# Patient Record
Sex: Male | Born: 1990 | Race: White | Hispanic: No | Marital: Married | Smoking: Former smoker
Health system: Southern US, Community
[De-identification: ages and names within clinical notes are randomized; demographics above are authoritative.]

---
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
## 2013-05-09 ENCOUNTER — Emergency Department: Payer: Self-pay

## 2013-07-15 ENCOUNTER — Emergency Department: Payer: Self-pay | Admitting: Emergency Medicine

## 2013-08-17 ENCOUNTER — Emergency Department: Payer: Self-pay | Admitting: Emergency Medicine

## 2013-09-09 ENCOUNTER — Emergency Department: Payer: Self-pay | Admitting: Emergency Medicine

## 2014-04-18 ENCOUNTER — Emergency Department: Payer: Self-pay | Admitting: Emergency Medicine

## 2014-04-18 LAB — URINALYSIS, COMPLETE
BACTERIA: NONE SEEN
BLOOD: NEGATIVE
Bilirubin,UR: NEGATIVE
GLUCOSE, UR: NEGATIVE mg/dL (ref 0–75)
KETONE: NEGATIVE
Leukocyte Esterase: NEGATIVE
NITRITE: NEGATIVE
Ph: 5 (ref 4.5–8.0)
Protein: NEGATIVE
RBC,UR: 1 /HPF (ref 0–5)
SPECIFIC GRAVITY: 1.031 (ref 1.003–1.030)
SQUAMOUS EPITHELIAL: NONE SEEN

## 2014-04-18 LAB — CBC
HCT: 48.2 % (ref 40.0–52.0)
HGB: 16.2 g/dL (ref 13.0–18.0)
MCH: 29.2 pg (ref 26.0–34.0)
MCHC: 33.7 g/dL (ref 32.0–36.0)
MCV: 87 fL (ref 80–100)
Platelet: 237 10*3/uL (ref 150–440)
RBC: 5.55 10*6/uL (ref 4.40–5.90)
RDW: 12.5 % (ref 11.5–14.5)
WBC: 9.1 10*3/uL (ref 3.8–10.6)

## 2014-04-18 LAB — COMPREHENSIVE METABOLIC PANEL
Albumin: 4.2 g/dL (ref 3.4–5.0)
Alkaline Phosphatase: 103 U/L
Anion Gap: 6 — ABNORMAL LOW (ref 7–16)
BILIRUBIN TOTAL: 0.3 mg/dL (ref 0.2–1.0)
BUN: 10 mg/dL (ref 7–18)
CHLORIDE: 107 mmol/L (ref 98–107)
CREATININE: 0.94 mg/dL (ref 0.60–1.30)
Calcium, Total: 9.3 mg/dL (ref 8.5–10.1)
Co2: 27 mmol/L (ref 21–32)
EGFR (Non-African Amer.): >60
Glucose: 104 mg/dL — ABNORMAL HIGH (ref 65–99)
OSMOLALITY: 279 (ref 275–301)
POTASSIUM: 3.4 mmol/L — AB (ref 3.5–5.1)
SGOT(AST): 15 U/L (ref 15–37)
SGPT (ALT): 41 U/L
SODIUM: 140 mmol/L (ref 136–145)
Total Protein: 7.5 g/dL (ref 6.4–8.2)

## 2014-04-18 LAB — LIPASE, BLOOD: Lipase: 85 U/L (ref 73–393)

## 2014-08-04 ENCOUNTER — Emergency Department
Admission: EM | Admit: 2014-08-04 | Discharge: 2014-08-05 | Disposition: A | Discharge status: Home / Self Care | Payer: Self-pay | Attending: Emergency Medicine | Admitting: Emergency Medicine

## 2014-08-04 ENCOUNTER — Encounter: Payer: Self-pay | Admitting: Emergency Medicine

## 2014-08-04 DIAGNOSIS — Y92410 Unspecified street and highway as the place of occurrence of the external cause: Secondary | ICD-10-CM | POA: Insufficient documentation

## 2014-08-04 DIAGNOSIS — Y998 Other external cause status: Secondary | ICD-10-CM | POA: Insufficient documentation

## 2014-08-04 DIAGNOSIS — T148 Other injury of unspecified body region: Secondary | ICD-10-CM | POA: Insufficient documentation

## 2014-08-04 DIAGNOSIS — T07XXXA Unspecified multiple injuries, initial encounter: Secondary | ICD-10-CM

## 2014-08-04 DIAGNOSIS — Y9389 Activity, other specified: Secondary | ICD-10-CM | POA: Insufficient documentation

## 2014-08-04 DIAGNOSIS — S4992XA Unspecified injury of left shoulder and upper arm, initial encounter: Secondary | ICD-10-CM | POA: Insufficient documentation

## 2014-08-04 DIAGNOSIS — Z720 Tobacco use: Secondary | ICD-10-CM | POA: Insufficient documentation

## 2014-08-04 DIAGNOSIS — S30811A Abrasion of abdominal wall, initial encounter: Secondary | ICD-10-CM | POA: Insufficient documentation
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
## 2014-08-05 ENCOUNTER — Emergency Department: Payer: Self-pay

## 2014-08-05 ENCOUNTER — Encounter: Payer: Self-pay | Admitting: Radiology

## 2014-08-05 LAB — CBC WITH DIFFERENTIAL/PLATELET
BASOS PCT: 0 %
Basophils Absolute: 0 10*3/uL (ref 0–0.1)
EOS ABS: 0.3 10*3/uL (ref 0–0.7)
Eosinophils Relative: 3 %
HEMATOCRIT: 47.7 % (ref 40.0–52.0)
Hemoglobin: 16.3 g/dL (ref 13.0–18.0)
LYMPHS ABS: 2.7 10*3/uL (ref 1.0–3.6)
LYMPHS PCT: 25 %
MCH: 29.7 pg (ref 26.0–34.0)
MCHC: 34.2 g/dL (ref 32.0–36.0)
MCV: 86.7 fL (ref 80.0–100.0)
Monocytes Absolute: 0.9 10*3/uL (ref 0.2–1.0)
Monocytes Relative: 8 %
NEUTROS ABS: 6.9 10*3/uL — AB (ref 1.4–6.5)
NEUTROS PCT: 64 %
PLATELETS: 244 10*3/uL (ref 150–440)
RBC: 5.5 MIL/uL (ref 4.40–5.90)
RDW: 12.6 % (ref 11.5–14.5)
WBC: 10.8 10*3/uL — ABNORMAL HIGH (ref 3.8–10.6)

## 2014-08-05 LAB — COMPREHENSIVE METABOLIC PANEL
ALBUMIN: 4.3 g/dL (ref 3.5–5.0)
ALT: 48 U/L (ref 17–63)
ANION GAP: 8 (ref 5–15)
AST: 26 U/L (ref 15–41)
Alkaline Phosphatase: 91 U/L (ref 38–126)
BILIRUBIN TOTAL: 0.3 mg/dL (ref 0.3–1.2)
BUN: 11 mg/dL (ref 6–20)
CALCIUM: 9.3 mg/dL (ref 8.9–10.3)
CO2: 29 mmol/L (ref 22–32)
Chloride: 105 mmol/L (ref 101–111)
Creatinine, Ser: 0.8 mg/dL (ref 0.61–1.24)
GFR calc Af Amer: >60 mL/min (ref >60)
GFR calc non Af Amer: >60 mL/min (ref >60)
Glucose, Bld: 90 mg/dL (ref 65–99)
Potassium: 3.6 mmol/L (ref 3.5–5.1)
Sodium: 142 mmol/L (ref 135–145)
Total Protein: 7.6 g/dL (ref 6.5–8.1)

## 2014-08-05 LAB — LIPASE, BLOOD: Lipase: 26 U/L (ref 22–51)

## 2014-08-05 LAB — PROTIME-INR
INR: 0.93
PROTHROMBIN TIME: 12.7 s (ref 11.4–15.0)

## 2014-08-05 LAB — ETHANOL: ALCOHOL ETHYL (B): 7 mg/dL — AB (ref <5)

## 2014-08-05 MED ORDER — ONDANSETRON HCL 4 MG/2ML IJ SOLN
4.0000 mg | Freq: Once | INTRAMUSCULAR | Status: AC
  Administered 2014-08-05: 4 mg via INTRAVENOUS

## 2014-08-05 MED ORDER — MORPHINE SULFATE 4 MG/ML IJ SOLN
4.0000 mg | Freq: Once | INTRAMUSCULAR | Status: AC
  Administered 2014-08-05: 4 mg via INTRAVENOUS

## 2014-08-05 MED ORDER — SODIUM CHLORIDE 0.9 % IV BOLUS (SEPSIS)
1000.0000 mL | Freq: Once | INTRAVENOUS | Status: AC
Start: 2014-08-05 — End: 2014-08-05
  Administered 2014-08-05: 1000 mL via INTRAVENOUS

## 2014-08-05 MED ORDER — IBUPROFEN 200 MG PO TABS: 800.0000 mg | ORAL_TABLET | Freq: Three times a day (TID) | ORAL | Status: DC | PRN

## 2014-08-05 MED ORDER — MORPHINE SULFATE 4 MG/ML IJ SOLN
INTRAMUSCULAR | Status: AC
  Filled 2014-08-05: qty 1

## 2014-08-05 MED ORDER — ONDANSETRON HCL 4 MG/2ML IJ SOLN
INTRAMUSCULAR | Status: AC
  Filled 2014-08-05: qty 2

## 2014-08-05 MED ORDER — OXYCODONE-ACETAMINOPHEN 5-325 MG PO TABS: 1.0000 | ORAL_TABLET | ORAL | Status: DC | PRN

## 2014-08-05 MED ORDER — IOHEXOL 350 MG/ML SOLN
100.0000 mL | Freq: Once | INTRAVENOUS | Status: AC | PRN
  Administered 2014-08-05: 100 mL via INTRAVENOUS
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
## 2015-11-04 ENCOUNTER — Encounter: Payer: Self-pay | Admitting: Emergency Medicine

## 2015-11-04 ENCOUNTER — Emergency Department: Payer: No Typology Code available for payment source

## 2015-11-04 ENCOUNTER — Emergency Department
Admission: EM | Admit: 2015-11-04 | Discharge: 2015-11-04 | Disposition: A | Discharge status: Home / Self Care | Payer: No Typology Code available for payment source | Attending: Emergency Medicine | Admitting: Emergency Medicine

## 2015-11-04 DIAGNOSIS — F17200 Nicotine dependence, unspecified, uncomplicated: Secondary | ICD-10-CM | POA: Diagnosis not present

## 2015-11-04 DIAGNOSIS — M545 Low back pain: Secondary | ICD-10-CM | POA: Diagnosis present

## 2015-11-04 DIAGNOSIS — Y999 Unspecified external cause status: Secondary | ICD-10-CM | POA: Diagnosis not present

## 2015-11-04 DIAGNOSIS — Y92410 Unspecified street and highway as the place of occurrence of the external cause: Secondary | ICD-10-CM | POA: Diagnosis not present

## 2015-11-04 DIAGNOSIS — G8911 Acute pain due to trauma: Secondary | ICD-10-CM

## 2015-11-04 DIAGNOSIS — T148XXA Other injury of unspecified body region, initial encounter: Secondary | ICD-10-CM

## 2015-11-04 DIAGNOSIS — Y9389 Activity, other specified: Secondary | ICD-10-CM | POA: Insufficient documentation

## 2015-11-04 DIAGNOSIS — T148 Other injury of unspecified body region: Secondary | ICD-10-CM | POA: Diagnosis not present

## 2015-11-04 MED ORDER — NAPROXEN 500 MG PO TABS: 500.0000 mg | ORAL_TABLET | Freq: Two times a day (BID) | ORAL | 0 refills | Status: DC

## 2015-11-04 MED ORDER — CYCLOBENZAPRINE HCL 10 MG PO TABS: 10.0000 mg | ORAL_TABLET | Freq: Three times a day (TID) | ORAL | 0 refills | Status: DC | PRN
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
## 2015-11-04 NOTE — ED Notes (Signed)
Pt verbalized understanding of discharge instructions. NAD at this time. 

## 2015-12-21 ENCOUNTER — Encounter: Payer: Self-pay | Admitting: *Deleted

## 2015-12-21 DIAGNOSIS — Z23 Encounter for immunization: Secondary | ICD-10-CM | POA: Insufficient documentation

## 2015-12-21 DIAGNOSIS — W260XXA Contact with knife, initial encounter: Secondary | ICD-10-CM | POA: Insufficient documentation

## 2015-12-21 DIAGNOSIS — Y9389 Activity, other specified: Secondary | ICD-10-CM | POA: Insufficient documentation

## 2015-12-21 DIAGNOSIS — F17200 Nicotine dependence, unspecified, uncomplicated: Secondary | ICD-10-CM | POA: Insufficient documentation

## 2015-12-21 DIAGNOSIS — Z791 Long term (current) use of non-steroidal anti-inflammatories (NSAID): Secondary | ICD-10-CM | POA: Insufficient documentation

## 2015-12-21 DIAGNOSIS — Y929 Unspecified place or not applicable: Secondary | ICD-10-CM | POA: Insufficient documentation

## 2015-12-21 DIAGNOSIS — S61210A Laceration without foreign body of right index finger without damage to nail, initial encounter: Secondary | ICD-10-CM | POA: Insufficient documentation

## 2015-12-21 DIAGNOSIS — Y999 Unspecified external cause status: Secondary | ICD-10-CM | POA: Insufficient documentation

## 2015-12-21 NOTE — ED Triage Notes (Signed)
Pt has a laceration to right index finger .  Cut finger with a knife.  Bleeding controlled.

## 2015-12-21 NOTE — ED Notes (Signed)
FIRST NURSE NOTE: Patient presents to with laceration to his RIGHT hand; over the PIP joint of the first digit. Patient reports that he cut it with a knife. Bleeding controlled at this time.

## 2015-12-22 ENCOUNTER — Emergency Department
Admission: EM | Admit: 2015-12-22 | Discharge: 2015-12-22 | Disposition: A | Discharge status: Home / Self Care | Payer: Self-pay | Attending: Emergency Medicine | Admitting: Emergency Medicine

## 2015-12-22 DIAGNOSIS — S61219A Laceration without foreign body of unspecified finger without damage to nail, initial encounter: Secondary | ICD-10-CM

## 2015-12-22 MED ORDER — TETANUS-DIPHTH-ACELL PERTUSSIS 5-2.5-18.5 LF-MCG/0.5 IM SUSP
INTRAMUSCULAR | Status: AC
  Administered 2015-12-22: 0.5 mL via INTRAMUSCULAR
  Filled 2015-12-22: qty 0.5

## 2015-12-22 MED ORDER — LIDOCAINE HCL (PF) 1 % IJ SOLN
INTRAMUSCULAR | Status: AC
Start: 2015-12-22 — End: 2015-12-22
  Administered 2015-12-22: 5 mL
  Filled 2015-12-22: qty 5

## 2015-12-22 NOTE — ED Notes (Signed)
Pt waiting to be sutured.  Pt informed md with critical pt.   Apologized for delay.

## 2015-12-22 NOTE — Discharge Instructions (Signed)
Recheck wound in 2 days return if there is any redness swelling pus or increased pain. Stitches should come out in about 7 days. You can [facility]-up with [facility] acute[facility] you can see is [facility] or [facility] or one of the urgent cares in the area. I would call and ask first to make sure they can take at stitches most of them But not all.

## 2015-12-22 NOTE — ED Notes (Signed)
Laceration dressed with gauze and 2x2's.  D/c inst to pt.

## 2015-12-22 NOTE — ED Provider Notes (Signed)
[facility] Emergency Department Provider Note   ____________________________________________   First MD Initiated Contact with Patient 12/22/15 0201     (approximate)  I have reviewed the triage vital signs and the nursing notes.   HISTORY  Chief Complaint Laceration    HPI Jacob Schmitt is a 25 y.o. male patient reports he cut his right index finger with a knife. Trying to open a box. He reports he is not open boxes similar object does not do any really dirty work with a knife. He reports she has good sensation in the tip of the finger and good range of motion.   No past medical history on file.  There are no active problems to display for this patient.   No past surgical history on file.  Prior to Admission medications   Medication Sig Start Date End Date Taking? Authorizing Provider  cyclobenzaprine (FLEXERIL) 10 MG tablet Take 1 tablet (10 mg total) by mouth 3 (three) times daily as needed for muscle spasms. 11/04/15   Chinita Pesterari B Triplett, FNP  naproxen (NAPROSYN) 500 MG tablet Take 1 tablet (500 mg total) by mouth 2 (two) times daily with a meal. 11/04/15   Chinita Pesterari B Triplett, FNP    Allergies Review of patient's allergies indicates no known allergies.  No family history on file.  Social History Social History  Substance Use Topics  . Smoking status: Light Tobacco Smoker  . Smokeless tobacco: Never Used  . Alcohol use 0.6 oz/week    1 Cans of beer per week    Review of Systems Constitutional: No fever/chills Eyes: No visual changes. ENT: No sore throat. Cardiovascular: Denies chest pain. Respiratory: Denies shortness of breath. Gastrointestinal: No abdominal pain.  No nausea, no vomiting.  No diarrhea.  No constipation. .  10-point ROS otherwise negative.  ____________________________________________   PHYSICAL EXAM:  VITAL SIGNS: ED Triage Vitals  Enc Vitals Group     BP 12/21/15 2152 130/82     Pulse Rate 12/21/15 2152  82     Resp 12/21/15 2152 16     Temp 12/21/15 2152 98.1 F (36.7 C)     Temp Source 12/21/15 2152 Oral     SpO2 12/21/15 2152 99 %     Weight 12/21/15 2153 259 lb (117.5 kg)     Height 12/21/15 2153 5\' 10"  (1.778 m)     Head Circumference --      Peak Flow --      Pain Score 12/21/15 2154 9     Pain Loc --      Pain Edu? --      Excl. in GC? --    Constitutional: Alert and oriented. Well appearing and in no acute distress. Eyes: Conjunctivae are normal. PERRL. EOMI. Head: Atraumatic. Nose: No congestion/rhinnorhea. Mouth/Throat: Mucous membranes are moist.  Oropharynx non-erythematous. Neck: No stridor.  Cardiovascular: Normal rate, regular rhythm.Peri [place].  Good peripheral circulation. Respiratory: Normal respiratory effort.  No retractions.  Gastrointestinal: Soft and nontender. No distention. No abdominal bruits. No CVA tenderness. Musculoskeletal: No lower extremity tenderness nor edema.  No joint effusions. Right index finger approximately 1 cm laceration on the lateral side at the PIP joint.   ____________________________________________   LABS (all labs ordered are listed, but only abnormal results are displayed)  Labs Reviewed - No data to display ____________________________________________  EKG   ____________________________________________  RADIOLOGY   ____________________________________________   PROCEDURES  Procedure(s) performed: Sensation distal to the laceration is intact. Patient has full range  of motion of the finger and good strength both flexion and extension of the distal and mid phalangeal phalanges. Wound was cleaned with Betadine and anesthetized with 1% lidocaine with epi irrigated with normal saline did not see any deep structures injured no foreign bodies were seen wound was closed with 3 stitches of  Procedures  Critical Care performed:  ____________________________________________   INITIAL IMPRESSION / ASSESSMENT AND PLAN / ED  COURSE  Pertinent labs & imaging results that were available during my care of the patient were reviewed by me and considered in my medical decision making (see chart for details).    Clinical Course     ____________________________________________   FINAL CLINICAL IMPRESSION(S) / ED DIAGNOSES  Final diagnoses:  Laceration of finger, initial encounter      NEW MEDICATIONS STARTED DURING THIS VISIT:  Discharge Medication List as of 12/22/2015  2:43 AM       Note:  This document was prepared using Dragon voice recognition software and may include unintentional dictation errors.    Arnaldo Natal, MD 12/22/15 [phone]

## 2015-12-22 NOTE — ED Notes (Signed)
Laceration to right index finger.  Cut  With a knife tonight.  Bleeding controlled.

## 2016-01-28 ENCOUNTER — Encounter: Payer: Self-pay | Admitting: Emergency Medicine

## 2016-01-28 ENCOUNTER — Emergency Department
Admission: EM | Admit: 2016-01-28 | Discharge: 2016-01-28 | Disposition: A | Discharge status: Home / Self Care | Payer: Self-pay | Attending: Emergency Medicine | Admitting: Emergency Medicine

## 2016-01-28 DIAGNOSIS — F17200 Nicotine dependence, unspecified, uncomplicated: Secondary | ICD-10-CM | POA: Insufficient documentation

## 2016-01-28 DIAGNOSIS — J069 Acute upper respiratory infection, unspecified: Secondary | ICD-10-CM

## 2016-01-28 DIAGNOSIS — Z79899 Other long term (current) drug therapy: Secondary | ICD-10-CM | POA: Insufficient documentation

## 2016-01-28 MED ORDER — ACETAMINOPHEN-CODEINE 120-12 MG/5ML PO SUSP: 10.0000 mL | Freq: Four times a day (QID) | ORAL | 0 refills | Status: AC | PRN

## 2016-01-28 NOTE — ED Triage Notes (Addendum)
Nasal congestion with intermittent fever and cough. Has had intermittent headache as well.  Took nyquil last night. Has had body aches. NAD. Ambulatory to triage. Respirations unlabored.

## 2016-01-28 NOTE — ED Provider Notes (Signed)
[facility] Emergency Department Provider Note  ____________________________________________  Time seen: Approximately 5:02 PM  I have reviewed the triage vital signs and the nursing notes.   HISTORY  Chief Complaint Nasal Congestion   HPI Jacob Schmitt is a 25 y.o. male who presents to the emergency department for evaluation of nasal congestion with fever and cough. No relief with Nyquil last night. Symptoms started 2 days ago. His toddler child has similar symptoms.   History reviewed. No pertinent past medical history.  There are no active problems to display for this patient.   History reviewed. No pertinent surgical history.  Prior to Admission medications   Medication Sig Start Date End Date Taking? Authorizing Provider  acetaminophen-codeine 120-12 MG/5ML suspension Take 10 mLs by mouth every 6 (six) hours as needed for pain. 01/28/16 01/27/17  Chinita Pesterari B Avenly Roberge, FNP  cyclobenzaprine (FLEXERIL) 10 MG tablet Take 1 tablet (10 mg total) by mouth 3 (three) times daily as needed for muscle spasms. 11/04/15   Chinita Pesterari B Tashera Montalvo, FNP  naproxen (NAPROSYN) 500 MG tablet Take 1 tablet (500 mg total) by mouth 2 (two) times daily with a meal. 11/04/15   Chinita Pesterari B Marquest Gunkel, FNP    Allergies Patient has no known allergies.  History reviewed. No pertinent family history.  Social History Social History  Substance Use Topics  . Smoking status: Light Tobacco Smoker  . Smokeless tobacco: Never Used  . Alcohol use 0.6 oz/week    1 Cans of beer per week    Review of Systems Constitutional: Positive fever/chills ENT: Positive sore throat. Cardiovascular: Denies chest pain. Respiratory: Negative for shortness of breath. Positive for cough. Gastrointestinal: Positive for nausea,  negative for vomiting.  Negative for diarrhea.  Musculoskeletal: Positive for body aches Skin: Negative for rash. Neurological: Negative for  headaches ____________________________________________   PHYSICAL EXAM:  VITAL SIGNS: ED Triage Vitals  Enc Vitals Group     BP 01/28/16 1638 133/78     Pulse Rate 01/28/16 1638 89     Resp 01/28/16 1638 18     Temp 01/28/16 1637 98.5 F (36.9 C)     Temp Source 01/28/16 1637 Oral     SpO2 01/28/16 1638 97 %     Weight 01/28/16 1637 251 lb (113.9 kg)     Height 01/28/16 1637 5\' 10"  (1.778 m)     Head Circumference --      Peak Flow --      Pain Score 01/28/16 1637 8     Pain Loc --      Pain Edu? --      Excl. in GC? --     Constitutional: Alert and oriented. Acutely ill appearing and in no acute distress. Eyes: Conjunctivae are normal. EOMI. Ears: Bilateral tympanic membranes appear within normal limits Nose: Sinus congestion; clear rhinnorhea with body mucous membranes. Mouth/Throat: Mucous membranes are moist.  Oropharynx erythematous. Tonsils appear 1+ without exudate. Neck: No stridor.  Lymphatic: No cervical lymphadenopathy. Cardiovascular: Normal rate, regular rhythm. Grossly normal heart sounds.  Good peripheral circulation. Respiratory: Normal respiratory effort.  No retractions. Breath sounds clear to auscultation throughout. Gastrointestinal: Soft and nontender.  Musculoskeletal: FROM x 4 extremities.  Neurologic:  Normal speech and language.  Skin:  Skin is warm, dry and intact. No rash noted. Psychiatric: Mood and affect are normal. Speech and behavior are normal.  ____________________________________________   LABS (all labs ordered are listed, but only abnormal results are displayed)  Labs Reviewed - No data to  display ____________________________________________  EKG  At indicated ____________________________________________  RADIOLOGY  Not indicated ____________________________________________   PROCEDURES  Procedure(s) performed: None  Critical Care performed: No  ____________________________________________   INITIAL IMPRESSION /  ASSESSMENT AND PLAN / ED COURSE  Clinical Course     Pertinent labs & imaging results that were available during my care of the patient were reviewed by me and considered in my medical decision making (see chart for details).   Patient was instructed to continue ibuprofen. He was instructed to take the Tylenol with Codeine for generalized body aches and fever with cough. He was instructed not to take additional Tylenol if he is taking the prescription elixir. He was encouraged to follow up with the primary care provider of his choice for symptoms that are not improving over the next 2-3 days. He is instructed to return to the emergency department for symptoms that change or worsen if he is unable schedule an appointment. ____________________________________________   FINAL CLINICAL IMPRESSION(S) / ED DIAGNOSES  Final diagnoses:  Viral upper respiratory tract infection    Note:  This document was prepared using Dragon voice recognition software and may include unintentional dictation errors.     Chinita PesterCari B Jacob Conry, FNP 01/28/16 1940    Sharman CheekPhillip Stafford, MD 01/28/16 [phone]

## 2016-01-28 NOTE — ED Notes (Signed)
Pt c/o cough and congestion x 2days. Pt febrile last night. Denies any n/v/d

## 2016-06-28 ENCOUNTER — Emergency Department
Admission: EM | Admit: 2016-06-28 | Discharge: 2016-06-28 | Disposition: A | Discharge status: Home / Self Care | Payer: Self-pay | Attending: Emergency Medicine | Admitting: Emergency Medicine

## 2016-06-28 ENCOUNTER — Encounter: Payer: Self-pay | Admitting: Intensive Care

## 2016-06-28 DIAGNOSIS — M5441 Lumbago with sciatica, right side: Secondary | ICD-10-CM | POA: Insufficient documentation

## 2016-06-28 DIAGNOSIS — Z87891 Personal history of nicotine dependence: Secondary | ICD-10-CM | POA: Insufficient documentation

## 2016-06-28 DIAGNOSIS — M5442 Lumbago with sciatica, left side: Secondary | ICD-10-CM | POA: Insufficient documentation

## 2016-06-28 MED ORDER — PREDNISONE 10 MG (21) PO TBPK: ORAL_TABLET | ORAL | 0 refills | Status: DC

## 2016-06-28 MED ORDER — KETOROLAC TROMETHAMINE 60 MG/2ML IM SOLN
30.0000 mg | Freq: Once | INTRAMUSCULAR | Status: AC
  Administered 2016-06-28: 30 mg via INTRAMUSCULAR
  Filled 2016-06-28: qty 2

## 2016-06-28 MED ORDER — IBUPROFEN 800 MG PO TABS: 800.0000 mg | ORAL_TABLET | Freq: Three times a day (TID) | ORAL | 0 refills | Status: DC | PRN

## 2016-06-28 NOTE — ED Provider Notes (Signed)
[facility] Emergency Department Provider Note  ____________________________________________  Time seen: Approximately 7:31 PM  I have reviewed the triage vital signs and the nursing notes.   HISTORY  Chief Complaint Back Pain    HPI Jacob Schmitt is a 26 y.o. male that presents to emergency department with low back pain for 2 weeks. Patient states that pain begins in the middle of his lower back and radiates down the back of both legs. Walking and movement triggers the pain. Patient has taken Goody powders and icy hot for pain. Patient states that he was diagnosed with sciatica several years ago and received "a shot of something and it went away." Patient denies fever, shortness of breath, chest pain, nausea, vomiting, abdominal pain, bowel or bladder dysfunction, dysuria, urgency, frequency, saddle paresthesias, numbness, tingling.   History reviewed. No pertinent past medical history.  There are no active problems to display for this patient.   History reviewed. No pertinent surgical history.  Prior to Admission medications   Medication Sig Start Date End Date Taking? Authorizing Provider  acetaminophen-codeine 120-12 MG/5ML suspension Take 10 mLs by mouth every 6 (six) hours as needed for pain. 01/28/16 01/27/17  Chinita Pester, FNP  cyclobenzaprine (FLEXERIL) 10 MG tablet Take 1 tablet (10 mg total) by mouth 3 (three) times daily as needed for muscle spasms. 11/04/15   Chinita Pester, FNP  ibuprofen (ADVIL,MOTRIN) 800 MG tablet Take 1 tablet (800 mg total) by mouth every 8 (eight) hours as needed. 06/28/16   Enid Derry, PA-C  naproxen (NAPROSYN) 500 MG tablet Take 1 tablet (500 mg total) by mouth 2 (two) times daily with a meal. 11/04/15   Cari B Triplett, FNP  predniSONE (STERAPRED UNI-PAK 21 TAB) 10 MG (21) TBPK tablet Take 6 tablets on day 1, take 5 tablets on day 2, take 4 tablets on day 3, take 3 tablets on day 4, take 2 tablets on day 5, take 1  tablet on day 6 06/28/16   Enid Derry, PA-C    Allergies Patient has no known allergies.  History reviewed. No pertinent family history.  Social History Social History  Substance Use Topics  . Smoking status: Former Games developer  . Smokeless tobacco: Never Used  . Alcohol use 0.6 oz/week    1 Cans of beer per week     Review of Systems  Constitutional: No fever/chills Cardiovascular: No chest pain. Respiratory: No SOB. Gastrointestinal: No abdominal pain.  No nausea, no vomiting.  Musculoskeletal: Positive for back pain. Skin: Negative for rash, abrasions, lacerations, ecchymosis. Neurological: Negative for headaches, numbness or tingling   ____________________________________________   PHYSICAL EXAM:  VITAL SIGNS: ED Triage Vitals  Enc Vitals Group     BP 06/28/16 1850 119/69     Pulse Rate 06/28/16 1850 78     Resp 06/28/16 1850 15     Temp 06/28/16 1850 98.3 F (36.8 C)     Temp Source 06/28/16 1850 Oral     SpO2 06/28/16 1850 98 %     Weight 06/28/16 1851 245 lb (111.1 kg)     Height 06/28/16 1851 5' 10.5" (1.791 m)     Head Circumference --      Peak Flow --      Pain Score 06/28/16 1850 10     Pain Loc --      Pain Edu? --      Excl. in GC? --      Constitutional: Alert and oriented. Well appearing and  in no acute distress. Eyes: Conjunctivae are normal. PERRL. EOMI. Head: Atraumatic. ENT:      Ears:      Nose: No congestion/rhinnorhea.      Mouth/Throat: Mucous membranes are moist.  Neck: No stridor.   Cardiovascular: Normal rate, regular rhythm.  Good peripheral circulation. Respiratory: Normal respiratory effort without tachypnea or retractions. Lungs CTAB. Good air entry to the bases with no decreased or absent breath sounds. Gastrointestinal: Bowel sounds 4 quadrants. Soft and nontender to palpation. No guarding or rigidity. No palpable masses. No distention. No CVA tenderness. Musculoskeletal: Full range of motion to all extremities. No gross  deformities appreciated. No tenderness to palpation over thoracic or lumbar spine. Tenderness to palpation over right and left SI joint. Positive straight leg and cross leg raise. Neurologic:  Normal speech and language. No gross focal neurologic deficits are appreciated.  Skin:  Skin is warm, dry and intact. No rash noted. Psychiatric: Mood and affect are normal. Speech and behavior are normal. Patient exhibits appropriate insight and judgement.   ____________________________________________   LABS (all labs ordered are listed, but only abnormal results are displayed)  Labs Reviewed - No data to display ____________________________________________  EKG   ____________________________________________  RADIOLOGY  No results found.  ____________________________________________    PROCEDURES  Procedure(s) performed:    Procedures    Medications  ketorolac (TORADOL) injection 30 mg (30 mg Intramuscular Given 06/28/16 1945)     ____________________________________________   INITIAL IMPRESSION / ASSESSMENT AND PLAN / ED COURSE  Pertinent labs & imaging results that were available during my care of the patient were reviewed by me and considered in my medical decision making (see chart for details).  Review of the [place] CSRS was performed in accordance of the [facility] prior to dispensing any controlled drugs.     Patient's diagnosis is consistent with low back pain with sciatica. Vital signs and exam are reassuring at this time. No trauma. I don't think there is indication for imaging at this time. Patient was given Toradol injection in ED. Patient will be discharged home with prescriptions for prednisone and ibuprofen. Patient is to follow up with PCP as directed. Patient is given ED precautions to return to the ED for any worsening or new symptoms.     ____________________________________________  FINAL CLINICAL IMPRESSION(S) / ED DIAGNOSES  Final diagnoses:  Acute  midline low back pain with bilateral sciatica      NEW MEDICATIONS STARTED DURING THIS VISIT:  Discharge Medication List as of 06/28/2016  7:46 PM    START taking these medications   Details  ibuprofen (ADVIL,MOTRIN) 800 MG tablet Take 1 tablet (800 mg total) by mouth every 8 (eight) hours as needed., Starting Fri 06/28/2016, Print    predniSONE (STERAPRED UNI-PAK 21 TAB) 10 MG (21) TBPK tablet Take 6 tablets on day 1, take 5 tablets on day 2, take 4 tablets on day 3, take 3 tablets on day 4, take 2 tablets on day 5, take 1 tablet on day 6, Print            This chart was dictated using voice recognition software/Dragon. Despite best efforts to proofread, errors can occur which can change the meaning. Any change was purely unintentional.    Enid Derry, PA-C 06/28/16 2006    Myrna Blazer, MD 06/28/16 [phone]

## 2016-06-28 NOTE — ED Triage Notes (Signed)
PAtient presents to ER with lower back pain X2 weeks. Denies injury/trauma. States "I feel like my back locks up when I go to stand. I have had to crawl around the house some" Pt transferred from wheelchair to bed with NAD noted

## 2016-09-04 ENCOUNTER — Emergency Department: Payer: Self-pay

## 2016-09-04 DIAGNOSIS — Z87891 Personal history of nicotine dependence: Secondary | ICD-10-CM | POA: Insufficient documentation

## 2016-09-04 DIAGNOSIS — Y92009 Unspecified place in unspecified non-institutional (private) residence as the place of occurrence of the external cause: Secondary | ICD-10-CM | POA: Insufficient documentation

## 2016-09-04 DIAGNOSIS — Y998 Other external cause status: Secondary | ICD-10-CM | POA: Insufficient documentation

## 2016-09-04 DIAGNOSIS — S82892A Other fracture of left lower leg, initial encounter for closed fracture: Secondary | ICD-10-CM | POA: Insufficient documentation

## 2016-09-04 DIAGNOSIS — Y9301 Activity, walking, marching and hiking: Secondary | ICD-10-CM | POA: Insufficient documentation

## 2016-09-04 DIAGNOSIS — W108XXA Fall (on) (from) other stairs and steps, initial encounter: Secondary | ICD-10-CM | POA: Insufficient documentation

## 2016-09-04 DIAGNOSIS — Z791 Long term (current) use of non-steroidal anti-inflammatories (NSAID): Secondary | ICD-10-CM | POA: Insufficient documentation

## 2016-09-04 NOTE — ED Triage Notes (Signed)
Pt to ED via POV with c/o LEFT ankle pain s/p tripping on some steps. CMS intact, no deformity noted.

## 2016-09-05 ENCOUNTER — Emergency Department
Admission: EM | Admit: 2016-09-05 | Discharge: 2016-09-05 | Disposition: A | Discharge status: Home / Self Care | Payer: Self-pay | Attending: Student in an Organized Health Care Education/Training Program | Admitting: Student in an Organized Health Care Education/Training Program

## 2016-09-05 DIAGNOSIS — S82892A Other fracture of left lower leg, initial encounter for closed fracture: Secondary | ICD-10-CM

## 2016-09-05 MED ORDER — OXYCODONE-ACETAMINOPHEN 5-325 MG PO TABS
1.0000 | ORAL_TABLET | Freq: Once | ORAL | Status: AC
  Administered 2016-09-05: 1 via ORAL

## 2016-09-05 MED ORDER — HYDROCODONE-ACETAMINOPHEN 5-325 MG PO TABS: 1.0000 | ORAL_TABLET | ORAL | 0 refills | Status: DC | PRN

## 2016-09-05 NOTE — ED Notes (Signed)

## 2016-09-05 NOTE — ED Provider Notes (Signed)
[facility] Emergency Department Provider Note    First MD Initiated Contact with Patient 09/05/16 0216     (approximate)  I have reviewed the triage vital signs and the nursing notes.   HISTORY  Chief Complaint Ankle Pain    HPI Jacob Schmitt is a 26 y.o. male presents with acute left ankle pain that occurred after falling while walking down some steps at home. States he initially felt twice after rolling his ankle first time. Denies hitting his head. No other injury. States the pain is mild to moderate. Has been able to walk but with significant discomfort. No numbness or tingling.   History reviewed. No pertinent past medical history. No family history on file. History reviewed. No pertinent surgical history. There are no active problems to display for this patient.     Prior to Admission medications   Medication Sig Start Date End Date Taking? Authorizing Provider  acetaminophen-codeine 120-12 MG/5ML suspension Take 10 mLs by mouth every 6 (six) hours as needed for pain. 01/28/16 01/27/17  Triplett, Kasandra Knudsenari B, FNP  cyclobenzaprine (FLEXERIL) 10 MG tablet Take 1 tablet (10 mg total) by mouth 3 (three) times daily as needed for muscle spasms. 11/04/15   Triplett, Rulon Eisenmengerari B, FNP  ibuprofen (ADVIL,MOTRIN) 800 MG tablet Take 1 tablet (800 mg total) by mouth every 8 (eight) hours as needed. 06/28/16   Enid DerryWagner, Ashley, PA-C  naproxen (NAPROSYN) 500 MG tablet Take 1 tablet (500 mg total) by mouth 2 (two) times daily with a meal. 11/04/15   Triplett, Cari B, FNP  predniSONE (STERAPRED UNI-PAK 21 TAB) 10 MG (21) TBPK tablet Take 6 tablets on day 1, take 5 tablets on day 2, take 4 tablets on day 3, take 3 tablets on day 4, take 2 tablets on day 5, take 1 tablet on day 6 06/28/16   Enid DerryWagner, Ashley, PA-C    Allergies Patient has no known allergies.    Social History Social History  Substance Use Topics  . Smoking status: Former Games developermoker  . Smokeless tobacco: Never  Used  . Alcohol use 0.6 oz/week    1 Cans of beer per week    Review of Systems Patient denies headaches, rhinorrhea, blurry vision, numbness, shortness of breath, chest pain, edema, cough, abdominal pain, nausea, vomiting, diarrhea, dysuria, fevers, rashes or hallucinations unless otherwise stated above in HPI. ____________________________________________   PHYSICAL EXAM:  VITAL SIGNS: Vitals:   09/04/16 2318  BP: 132/77  Pulse: 89  Resp: 18  Temp: 98.9 F (37.2 C)    Constitutional: Alert and oriented. Well appearing and in no acute distress. Eyes: Conjunctivae are normal.  Head: Atraumatic. Nose: No congestion/rhinnorhea. Mouth/Throat: Mucous membranes are moist.   Neck: No stridor. Painless ROM.  Cardiovascular: Normal rate, regular rhythm. Grossly normal heart sounds.  Good peripheral circulation. Respiratory: Normal respiratory effort.  No retractions. Lungs CTAB. Gastrointestinal: Soft and nontender. No distention. No abdominal bruits. No CVA tenderness. Musculoskeletal: swelling and ttp to lateral malleolus, no calcaneal or midfoot ttp.  No pain at proximal fibula Neurologic:  Normal speech and language. No gross focal neurologic deficits are appreciated. No facial droop Skin:  Skin is warm, dry and intact. No rash noted. Psychiatric: Mood and affect are normal. Speech and behavior are normal.  ____________________________________________   LABS (all labs ordered are listed, but only abnormal results are displayed)  No results found for this or any previous visit (from the past 24 hour(s)). ____________________________________________ ___________________________________  RADIOLOGY  I personally  reviewed all radiographic images ordered to evaluate for the above acute complaints and reviewed radiology reports and findings.  These findings were personally discussed with the patient.  Please see medical record for radiology  report.  ____________________________________________   PROCEDURES  Procedure(s) performed:  ORTHOPEDIC INJURY TREATMENT Date/Time: 09/05/2016 2:50 AM Performed by: Willy Eddy Authorized by: Willy Eddy  Injury location: ankle Location details: right ankle Injury type: fracture Fracture type: lateral malleolus Pre-procedure distal perfusion: normal Pre-procedure neurological function: normal Pre-procedure range of motion: reduced Manipulation performed: no Immobilization: splint Splint type: short leg Supplies used: Ortho-Glass Post-procedure distal perfusion: normal Post-procedure neurological function: normal Post-procedure range of motion: unchanged Patient tolerance: Patient tolerated the procedure well with no immediate complications       Critical Care performed: no ____________________________________________   INITIAL IMPRESSION / ASSESSMENT AND PLAN / ED COURSE  Pertinent labs & imaging results that were available during my care of the patient were reviewed by me and considered in my medical decision making (see chart for details).  DDX: fracture, sprain, contusion  LEXIE MORINI is a 26 y.o. who presents to the ED with 26 y.o. male with left ankle injury. Patient is AFVSS in ED. Exam as above. Given current presentation have considered the above differential. Denies any other injuries. Denies motor or sensory loss. Able to bear weight. Afebrile and VSS in Ed. Exam as above. NV intact throughout and distal to injury. Pt able to range joint. No clinical suspicion for infectious process or septic joint. Treatments will include observation, X-rays.  X-rays w fracture. No other injuries reported or noted on exam. Patient placed in splint as above.   Discussed supportive care and follow up with pt.       ____________________________________________   FINAL CLINICAL IMPRESSION(S) / ED DIAGNOSES  Final diagnoses:  Closed fracture of left  ankle, initial encounter      NEW MEDICATIONS STARTED DURING THIS VISIT:  New Prescriptions   No medications on file     Note:  This document was prepared using Dragon voice recognition software and may include unintentional dictation errors.    Willy Eddy, MD 09/05/16 0300

## 2016-09-29 ENCOUNTER — Emergency Department
Admission: EM | Admit: 2016-09-29 | Discharge: 2016-09-29 | Discharge status: Against Medical Advice | Payer: Self-pay | Attending: Emergency Medicine | Admitting: Emergency Medicine

## 2016-09-29 NOTE — ED Notes (Signed)
Called pt x2 again for triage with no answer

## 2016-09-29 NOTE — ED Notes (Signed)
Pt did not answer for triage when called x2

## 2016-12-18 ENCOUNTER — Emergency Department
Admission: EM | Admit: 2016-12-18 | Discharge: 2016-12-18 | Disposition: A | Discharge status: Home / Self Care | Payer: Self-pay | Attending: Emergency Medicine | Admitting: Emergency Medicine

## 2016-12-18 ENCOUNTER — Encounter: Payer: Self-pay | Admitting: Emergency Medicine

## 2016-12-18 DIAGNOSIS — R220 Localized swelling, mass and lump, head: Secondary | ICD-10-CM

## 2016-12-18 DIAGNOSIS — K0381 Cracked tooth: Secondary | ICD-10-CM | POA: Insufficient documentation

## 2016-12-18 DIAGNOSIS — S025XXA Fracture of tooth (traumatic), initial encounter for closed fracture: Secondary | ICD-10-CM

## 2016-12-18 DIAGNOSIS — K047 Periapical abscess without sinus: Secondary | ICD-10-CM | POA: Insufficient documentation

## 2016-12-18 LAB — CBC
HCT: 48.1 % (ref 40.0–52.0)
Hemoglobin: 16.4 g/dL (ref 13.0–18.0)
MCH: 29.8 pg (ref 26.0–34.0)
MCHC: 34 g/dL (ref 32.0–36.0)
MCV: 87.7 fL (ref 80.0–100.0)
Platelets: 226 K/uL (ref 150–440)
RBC: 5.49 MIL/uL (ref 4.40–5.90)
RDW: 12.7 % (ref 11.5–14.5)
WBC: 10.7 K/uL — ABNORMAL HIGH (ref 3.8–10.6)

## 2016-12-18 MED ORDER — SODIUM CHLORIDE 0.9 % IV BOLUS (SEPSIS)
1000.0000 mL | Freq: Once | INTRAVENOUS | Status: AC
  Administered 2016-12-18: 1000 mL via INTRAVENOUS

## 2016-12-18 MED ORDER — OXYCODONE-ACETAMINOPHEN 5-325 MG PO TABS: 1.0000 | ORAL_TABLET | Freq: Four times a day (QID) | ORAL | 0 refills | Status: DC | PRN

## 2016-12-18 MED ORDER — PENICILLIN V POTASSIUM 500 MG PO TABS: 500.0000 mg | ORAL_TABLET | Freq: Four times a day (QID) | ORAL | 0 refills | Status: DC

## 2016-12-18 MED ORDER — OXYCODONE-ACETAMINOPHEN 5-325 MG PO TABS
1.0000 | ORAL_TABLET | Freq: Once | ORAL | Status: AC
  Administered 2016-12-18: 1 via ORAL
  Filled 2016-12-18: qty 1

## 2016-12-18 MED ORDER — SODIUM CHLORIDE 0.9 % IV SOLN
3.0000 g | Freq: Once | INTRAVENOUS | Status: AC
  Administered 2016-12-18: 3 g via INTRAVENOUS
  Filled 2016-12-18: qty 3

## 2016-12-18 MED ORDER — LIDOCAINE-EPINEPHRINE (PF) 2 %-1:200000 IJ SOLN
30.0000 mL | Freq: Once | INTRAMUSCULAR | Status: DC
  Filled 2016-12-18: qty 30

## 2016-12-18 NOTE — ED Notes (Signed)
Alert, oriented, ambulatory. L sided facial swelling started this AM 4:30. States L sided dental pain x few days. States has not been to dentist in "a while." states he thinks he has an abscess. Denies fevers today. Speech clear.

## 2016-12-18 NOTE — ED Notes (Signed)

## 2016-12-18 NOTE — ED Provider Notes (Addendum)
[facility] Emergency Department Provider Note  ____________________________________________   First MD Initiated Contact with Patient 12/18/16 1603     (approximate)  I have reviewed the triage vital signs and the nursing notes.   HISTORY  Chief Complaint Facial Swelling and Dental Pain   HPI Jacob Schmitt is a 26 y.o. male who fractured his tooth #18 one year ago who is presenting with swelling to his left mandible. He says that the swelling was minimal this morning and has increased throughout the day. He denies a history of diabetes. Says that he feels pressure radiating down into his neck. Says it hurts to swallow but is able swallow. Denies any difficulty breathing.   History reviewed. No pertinent past medical history.  There are no active problems to display for this patient.   History reviewed. No pertinent surgical history.  Prior to Admission medications   Medication Sig Start Date End Date Taking? Authorizing Provider  acetaminophen-codeine 120-12 MG/5ML suspension Take 10 mLs by mouth every 6 (six) hours as needed for pain. Patient not taking: Reported on 12/18/2016 01/28/16 01/27/17  Chinita Pester, FNP  cyclobenzaprine (FLEXERIL) 10 MG tablet Take 1 tablet (10 mg total) by mouth 3 (three) times daily as needed for muscle spasms. Patient not taking: Reported on 12/18/2016 11/04/15   Kem Boroughs B, FNP  HYDROcodone-acetaminophen (NORCO) 5-325 MG tablet Take 1 tablet by mouth every 4 (four) hours as needed for moderate pain. Patient not taking: Reported on 12/18/2016 09/05/16   Willy Eddy, MD  ibuprofen (ADVIL,MOTRIN) 800 MG tablet Take 1 tablet (800 mg total) by mouth every 8 (eight) hours as needed. Patient not taking: Reported on 12/18/2016 06/28/16   Enid Derry, PA-C  naproxen (NAPROSYN) 500 MG tablet Take 1 tablet (500 mg total) by mouth 2 (two) times daily with a meal. Patient not taking: Reported on 12/18/2016 11/04/15    Chinita Pester, FNP  predniSONE (STERAPRED UNI-PAK 21 TAB) 10 MG (21) TBPK tablet Take 6 tablets on day 1, take 5 tablets on day 2, take 4 tablets on day 3, take 3 tablets on day 4, take 2 tablets on day 5, take 1 tablet on day 6 Patient not taking: Reported on 12/18/2016 06/28/16   Enid Derry, PA-C    Allergies Patient has no known allergies.  No family history on file.  Social History Social History  Substance Use Topics  . Smoking status: Former Games developer  . Smokeless tobacco: Never Used  . Alcohol use 0.6 oz/week    1 Cans of beer per week    Review of Systems  Constitutional: No fever/chills Eyes: No visual changes. ENT: No sore throat. Cardiovascular: Denies chest pain. Respiratory: Denies shortness of breath. Gastrointestinal: No abdominal pain.  No nausea, no vomiting.  No diarrhea.  No constipation. Genitourinary: Negative for dysuria. Musculoskeletal: Negative for back pain. Skin: Negative for rash. Neurological: Negative for headaches, focal weakness or numbness.   ____________________________________________   PHYSICAL EXAM:  VITAL SIGNS: ED Triage Vitals [12/18/16 1337]  Enc Vitals Group     BP 129/83     Pulse Rate 74     Resp 16     Temp 98.6 F (37 C)     Temp Source Oral     SpO2 98 %     Weight 225 lb (102.1 kg)     Height  (1.778 m)     Head Circumference      Peak Flow  Pain Score 10     Pain Loc      Pain Edu?      Excl. in GC?     Constitutional: Alert and oriented. Well appearing and in no acute distress. Eyes: Conjunctivae are normal.  Head: Atraumatic. Nose: No congestion/rhinnorhea. Mouth/Throat: Mucous membranes are moist.  fractured tooth #18 with swelling extending from this tooth with induration palpated adjacent to the tooth as well as indurationto the lateral aspect around the mandibular angle. however, no definitive fluctuance.Swelling extends around the mandibular angle but there is no extension down to the  neck. No erythema. No pus drainage.  Neck: No stridor.   Cardiovascular: Normal rate, regular rhythm. Grossly normal heart sounds.  Good peripheral circulation. Respiratory: Normal respiratory effort.  No retractions. Lungs CTAB. Gastrointestinal: Soft and nontender. No distention. No CVA tenderness. Musculoskeletal: No lower extremity tenderness nor edema.  No joint effusions. Neurologic:  Normal speech and language. No gross focal neurologic deficits are appreciated. Skin:  Skin is warm, dry and intact. No rash noted. Psychiatric: Mood and affect are normal. Speech and behavior are normal.  ____________________________________________   LABS (all labs ordered are listed, but only abnormal results are displayed)  Labs Reviewed  CBC - Abnormal; Notable for the following:       Result Value   WBC 10.7 (*)    All other components within normal limits   ____________________________________________  EKG   ____________________________________________  RADIOLOGY   ____________________________________________   PROCEDURES  Procedure(s) performed:   INCISION AND DRAINAGE Performed by: Arelia Longest Consent: Verbal consent obtained. Risks and benefits: risks, benefits and alternatives were discussed Type: abscess  Body area: peridontal abscess  Anesthesia: local infiltration  attempt to aspirate with 18-gauge needle  Local anesthetic: lidocaine 1% with epinephrine  Anesthetic total: 4 ml  Complexity: complex Blunt dissection to break up loculations  Drainage: none  Drainage amount: 0  Patient tolerance: Patient tolerated the procedure well with no immediate complications.  unable to aspirate any pus. I will not incise with an 11 blade because of this.     Procedures  Critical Care performed:   ____________________________________________   INITIAL IMPRESSION / ASSESSMENT AND PLAN / ED COURSE  Pertinent labs & imaging results that were  available during my care of the patient were reviewed by me and considered in my medical decision making (see chart for details).  ----------------------------------------- 6:28 PM on 12/18/2016 -----------------------------------------  Patient without worsening of the swelling throughout his stay in the emergency department. He is speaking in a normal voice. Controlling his secretions. No respiratory distress. He'll be given a first dose of Unasyn prior to discharge and will be discharged with penicillin VK. I will give him a list of dental clinics and he knows that he must follow-up tomorrow to have the tooth pulled. He knows to return to the emergency department for any worsening or concerning symptoms especially difficulty breathing, drooling or feeling of his throat closing or any worsening or concerning symptoms. He is understanding of this plan and willing to comply.      ____________________________________________   FINAL CLINICAL IMPRESSION(S) / ED DIAGNOSES  Tooth fracture. Facial swelling    NEW MEDICATIONS STARTED DURING THIS VISIT:  New Prescriptions   No medications on file     Note:  This document was prepared using Dragon voice recognition software and may include unintentional dictation errors.     Myrna Blazer, MD 12/18/16 Izola Price    Myrna Blazer, MD 12/18/16  1830  

## 2016-12-18 NOTE — ED Triage Notes (Signed)
Dental pain x 2 days.  Today has swelling of left jaw extending down to neck.

## 2016-12-18 NOTE — ED Notes (Signed)
This RN and Dr. Pershing Proud at bedside to attempt to drain pt's abscess.

## 2016-12-18 NOTE — ED Notes (Signed)
Pt resting in bed. NAD noted at this time. Pt is alert and oriented, c/o pain to L lower jaw with swelling. Pt states swelling started this morning. Apologized for delay. Will continue to monitor for further patient needs.

## 2016-12-18 NOTE — ED Notes (Signed)
Patient became lightheaded and got sl diaphoretic right after iv stick.  Pt lay in the recliner with feet up  Ns started iv wide open and patient quickly felt better.
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
## 2017-08-21 ENCOUNTER — Other Ambulatory Visit: Payer: Self-pay

## 2017-08-21 ENCOUNTER — Encounter: Payer: Self-pay | Admitting: Emergency Medicine

## 2017-08-21 ENCOUNTER — Emergency Department
Admission: EM | Admit: 2017-08-21 | Discharge: 2017-08-21 | Disposition: A | Discharge status: Home / Self Care | Payer: Self-pay | Attending: Emergency Medicine | Admitting: Emergency Medicine

## 2017-08-21 DIAGNOSIS — Z87891 Personal history of nicotine dependence: Secondary | ICD-10-CM | POA: Insufficient documentation

## 2017-08-21 DIAGNOSIS — H5711 Ocular pain, right eye: Secondary | ICD-10-CM | POA: Insufficient documentation

## 2017-08-21 MED ORDER — FLUORESCEIN SODIUM 1 MG OP STRP
1.0000 | ORAL_STRIP | Freq: Once | OPHTHALMIC | Status: AC
  Administered 2017-08-21: 1 via OPHTHALMIC

## 2017-08-21 MED ORDER — TETRACAINE HCL 0.5 % OP SOLN
OPHTHALMIC | Status: AC
  Administered 2017-08-21: 2 [drp] via OPHTHALMIC
  Filled 2017-08-21: qty 4

## 2017-08-21 MED ORDER — TETRACAINE HCL 0.5 % OP SOLN
2.0000 [drp] | Freq: Once | OPHTHALMIC | Status: AC
  Administered 2017-08-21: 2 [drp] via OPHTHALMIC

## 2017-08-21 MED ORDER — AZELASTINE HCL 0.05 % OP SOLN: 1.0000 [drp] | Freq: Two times a day (BID) | OPHTHALMIC | 1 refills | Status: AC

## 2017-08-21 MED ORDER — KETOROLAC TROMETHAMINE 0.5 % OP SOLN: 1.0000 [drp] | Freq: Four times a day (QID) | OPHTHALMIC | 0 refills | Status: AC

## 2017-08-21 MED ORDER — FLUORESCEIN SODIUM 1 MG OP STRP
ORAL_STRIP | OPHTHALMIC | Status: AC
  Filled 2017-08-21: qty 1

## 2017-08-21 NOTE — ED Triage Notes (Addendum)
Developed right eye pain about 3 days ago  Denies any injury or drainage

## 2017-08-21 NOTE — ED Provider Notes (Signed)
[facility] Emergency Department Provider Note  ____________________________________________  Time seen: Approximately 8:31 PM  I have reviewed the triage vital signs and the nursing notes.   HISTORY  Chief Complaint Eye Pain    HPI Jacob Schmitt is a 27 y.o. male who presents the emergency department complaining of right eye pain.  Patient denies any trauma, visual changes to the right eye.  Patient does wear glasses but does not wear contacts.  He has noticed no erythema or purulent drainage.  No excessive tearing.  Symptoms have been ongoing x2 days.  No medications for this complaint prior to arrival.  History reviewed. No pertinent past medical history.  There are no active problems to display for this patient.   History reviewed. No pertinent surgical history.  Prior to Admission medications   Medication Sig Start Date End Date Taking? Authorizing Provider  azelastine (OPTIVAR) 0.05 % ophthalmic solution Place 1 drop into the left eye 2 (two) times daily. 08/21/17   Donis Kotowski, Delorise Royals, PA-C  cyclobenzaprine (FLEXERIL) 10 MG tablet Take 1 tablet (10 mg total) by mouth 3 (three) times daily as needed for muscle spasms. Patient not taking: Reported on 12/18/2016 11/04/15   Kem Boroughs B, FNP  HYDROcodone-acetaminophen (NORCO) 5-325 MG tablet Take 1 tablet by mouth every 4 (four) hours as needed for moderate pain. Patient not taking: Reported on 12/18/2016 09/05/16   Willy Eddy, MD  ibuprofen (ADVIL,MOTRIN) 800 MG tablet Take 1 tablet (800 mg total) by mouth every 8 (eight) hours as needed. Patient not taking: Reported on 12/18/2016 06/28/16   Enid Derry, PA-C  ketorolac (ACULAR) 0.5 % ophthalmic solution Place 1 drop into the right eye 4 (four) times daily. 08/21/17   Mckensie Scotti, Delorise Royals, PA-C  naproxen (NAPROSYN) 500 MG tablet Take 1 tablet (500 mg total) by mouth 2 (two) times daily with a meal. Patient not taking: Reported on 12/18/2016  11/04/15   Kem Boroughs B, FNP  oxyCODONE-acetaminophen (ROXICET) 5-325 MG tablet Take 1-2 tablets by mouth every 6 (six) hours as needed. 12/18/16   Schaevitz, Myra Rude, MD  penicillin v potassium (VEETID) 500 MG tablet Take 1 tablet (500 mg total) by mouth 4 (four) times daily. 12/18/16   Schaevitz, Myra Rude, MD  predniSONE (STERAPRED UNI-PAK 21 TAB) 10 MG (21) TBPK tablet Take 6 tablets on day 1, take 5 tablets on day 2, take 4 tablets on day 3, take 3 tablets on day 4, take 2 tablets on day 5, take 1 tablet on day 6 Patient not taking: Reported on 12/18/2016 06/28/16   Enid Derry, PA-C    Allergies Patient has no known allergies.  No family history on file.  Social History Social History   Tobacco Use  . Smoking status: Former Games developer  . Smokeless tobacco: Never Used  Substance Use Topics  . Alcohol use: Yes    Alcohol/week: 0.6 oz    Types: 1 Cans of beer per week  . Drug use: Yes    Types: Marijuana     Review of Systems  Constitutional: No fever/chills Eyes: No visual changes. No discharge.  Positive for right eye pain ENT: No upper respiratory complaints. Cardiovascular: no chest pain. Respiratory: no cough. No SOB. Gastrointestinal: No abdominal pain.  No nausea, no vomiting.  No diarrhea.  No constipation. Musculoskeletal: Negative for musculoskeletal pain. Skin: Negative for rash, abrasions, lacerations, ecchymosis. Neurological: Negative for headaches, focal weakness or numbness. 10-point ROS otherwise negative.  ____________________________________________   PHYSICAL EXAM:  VITAL SIGNS: ED Triage Vitals [08/21/17 1813]  Enc Vitals Group     BP (!) 143/83     Pulse Rate 83     Resp 18     Temp 98.2 F (36.8 C)     Temp Source Oral     SpO2 100 %     Weight 235 lb (106.6 kg)     Height  (1.803 m)     Head Circumference      Peak Flow      Pain Score 8     Pain Loc      Pain Edu?      Excl. in GC?      Constitutional: Alert and  oriented. Well appearing and in no acute distress. Eyes: Conjunctivae are normal. PERRL. EOMI. funduscopic exam reveals red reflex bilaterally.  Vasculature and optic discs unremarkable bilaterally.  Eyes anesthetized using tetracaine and fluorescein staining is applied.  No areas of uptake.  Patient reports complete symptomatic improvement with tetracaine. Head: Atraumatic. ENT:      Ears:       Nose: No congestion/rhinnorhea.      Mouth/Throat: Mucous membranes are moist.  Neck: No stridor.    Cardiovascular: Normal rate, regular rhythm. Normal S1 and S2.  Good peripheral circulation. Respiratory: Normal respiratory effort without tachypnea or retractions. Lungs CTAB. Good air entry to the bases with no decreased or absent breath sounds. Musculoskeletal: Full range of motion to all extremities. No gross deformities appreciated. Neurologic:  Normal speech and language. No gross focal neurologic deficits are appreciated.  Skin:  Skin is warm, dry and intact. No rash noted. Psychiatric: Mood and affect are normal. Speech and behavior are normal. Patient exhibits appropriate insight and judgement.   ____________________________________________   LABS (all labs ordered are listed, but only abnormal results are displayed)  Labs Reviewed - No data to display ____________________________________________  EKG   ____________________________________________  RADIOLOGY   No results found.  ____________________________________________    PROCEDURES  Procedure(s) performed:    Procedures    Medications  fluorescein ophthalmic strip 1 strip (has no administration in time range)  tetracaine (PONTOCAINE) 0.5 % ophthalmic solution 2 drop (has no administration in time range)     ____________________________________________   INITIAL IMPRESSION / ASSESSMENT AND PLAN / ED COURSE  Pertinent labs & imaging results that were available during my care of the patient were reviewed  by me and considered in my medical decision making (see chart for details).  Review of the [place] CSRS was performed in accordance of the [facility] prior to dispensing any controlled drugs.     Patient's diagnosis is consistent with right eye pain.  Patient presents the emergency department complaints of right eye pain.  On exam, exam was unremarkable with no significant findings.  Patient symptoms completely improved with the use of tetracaine drops.  At this time, differential includes minor corneal abrasion not seen with floor seen uptake, dry eyes, allergic conjunctivitis, ophthalmic bruising. Patient will be discharged home with prescriptions for Acular and Optivar for symptom improvement. Patient is to follow up with ophthalmology as needed or otherwise directed. Patient is given ED precautions to return to the ED for any worsening or new symptoms.     ____________________________________________  FINAL CLINICAL IMPRESSION(S) / ED DIAGNOSES  Final diagnoses:  Pain of right eye      NEW MEDICATIONS STARTED DURING THIS VISIT:  ED Discharge Orders        Ordered    azelastine (  OPTIVAR) 0.05 % ophthalmic solution  2 times daily     08/21/17 2030    ketorolac (ACULAR) 0.5 % ophthalmic solution  4 times daily     08/21/17 2030          This chart was dictated using voice recognition software/Dragon. Despite best efforts to proofread, errors can occur which can change the meaning. Any change was purely unintentional.    Racheal Patches, PA-C 08/21/17 2033    Sharyn Creamer, MD 08/22/17 [phone]

## 2018-03-23 IMAGING — CR DG ANKLE COMPLETE 3+V*L*
3 series · 3 of 3 positions shown · non-contrast
Comparison: 08/17/2013

CLINICAL DATA: Left ankle pain after tripping on steps

EXAM:
LEFT ANKLE COMPLETE - 3+ VIEW

[ankle ap]
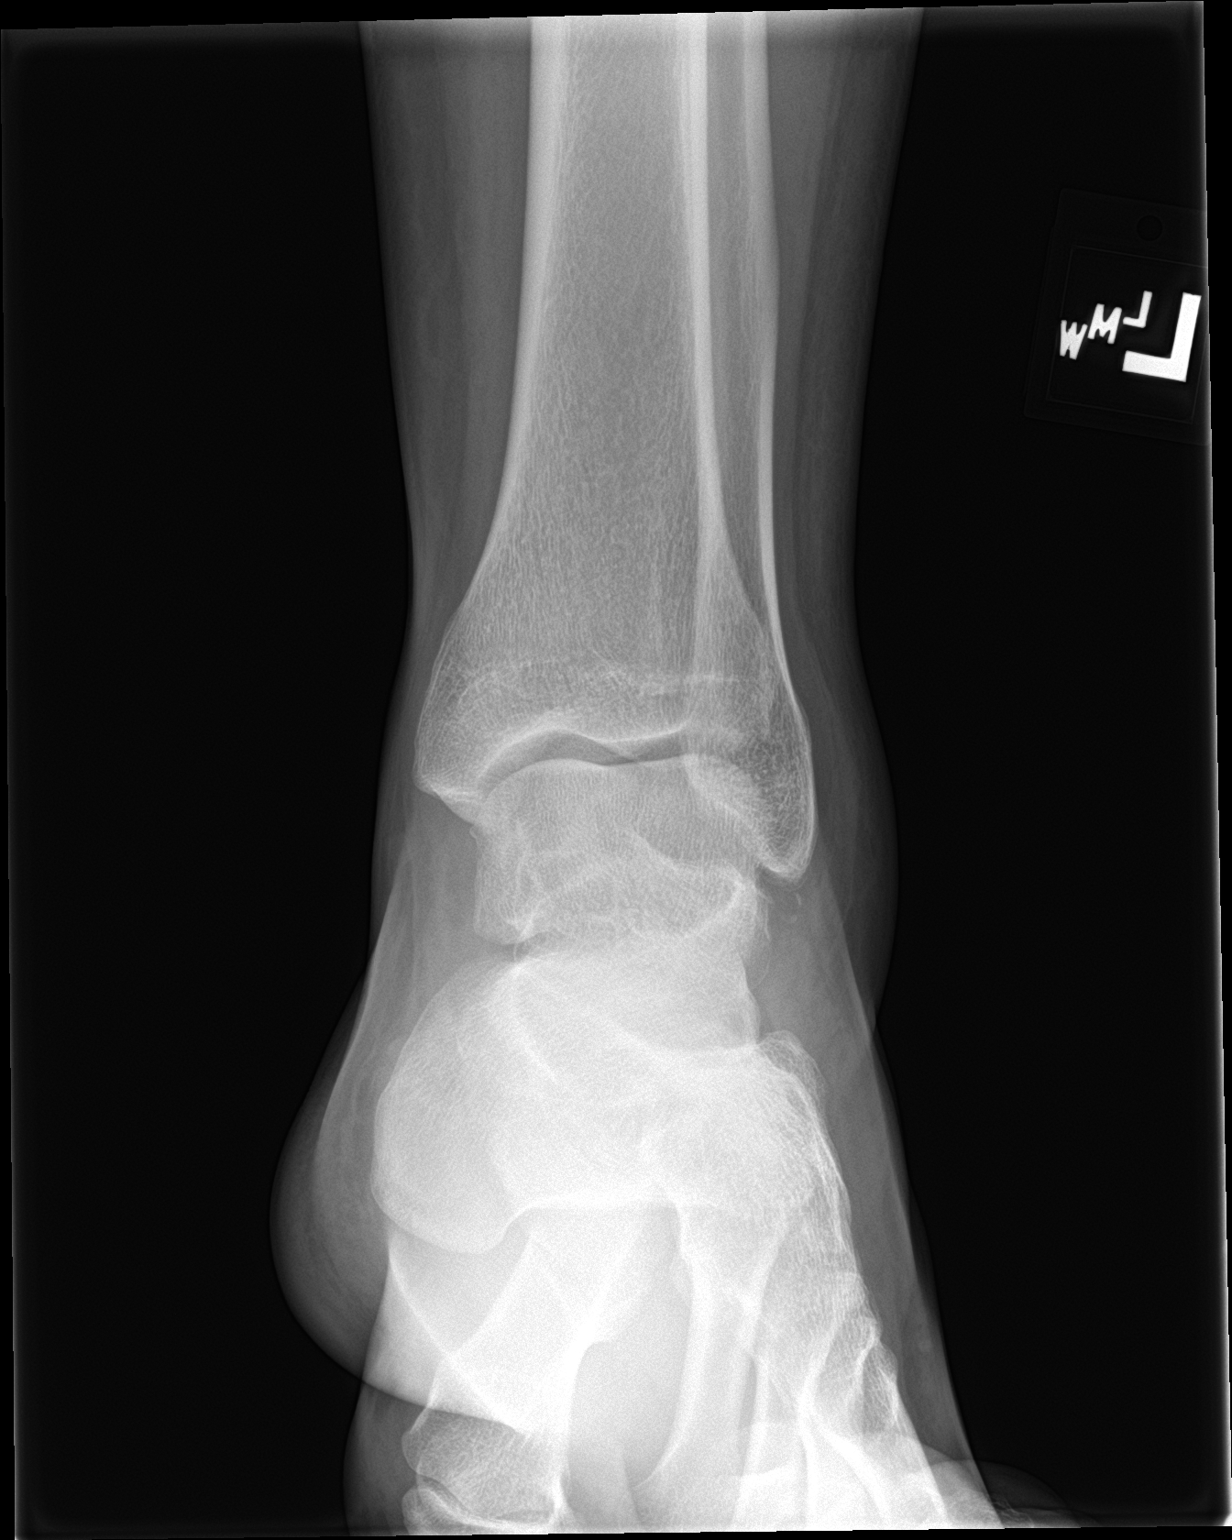

[ankle obl]
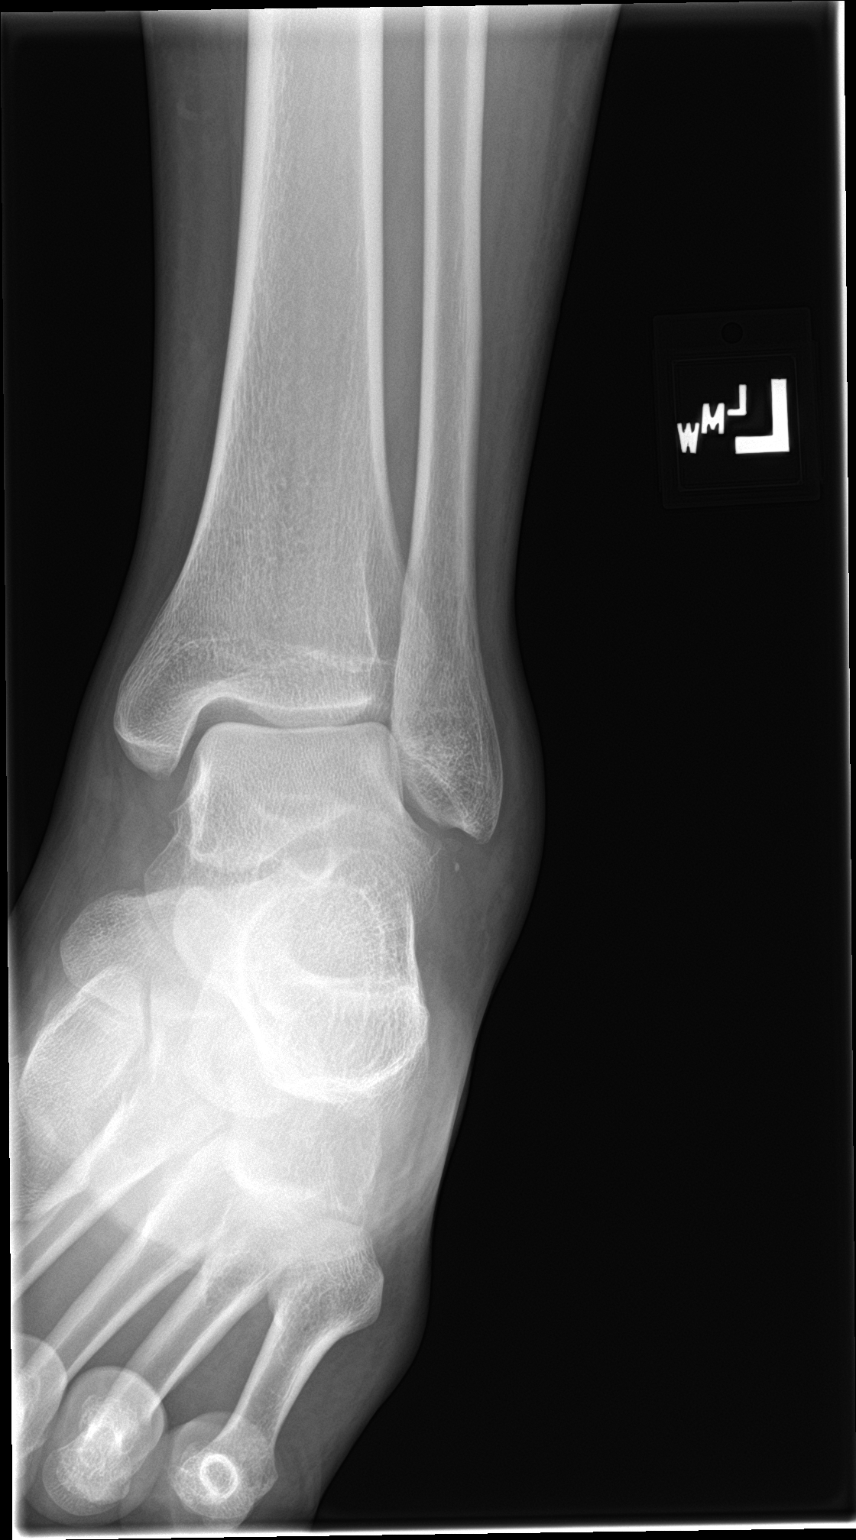

[ankle lat]
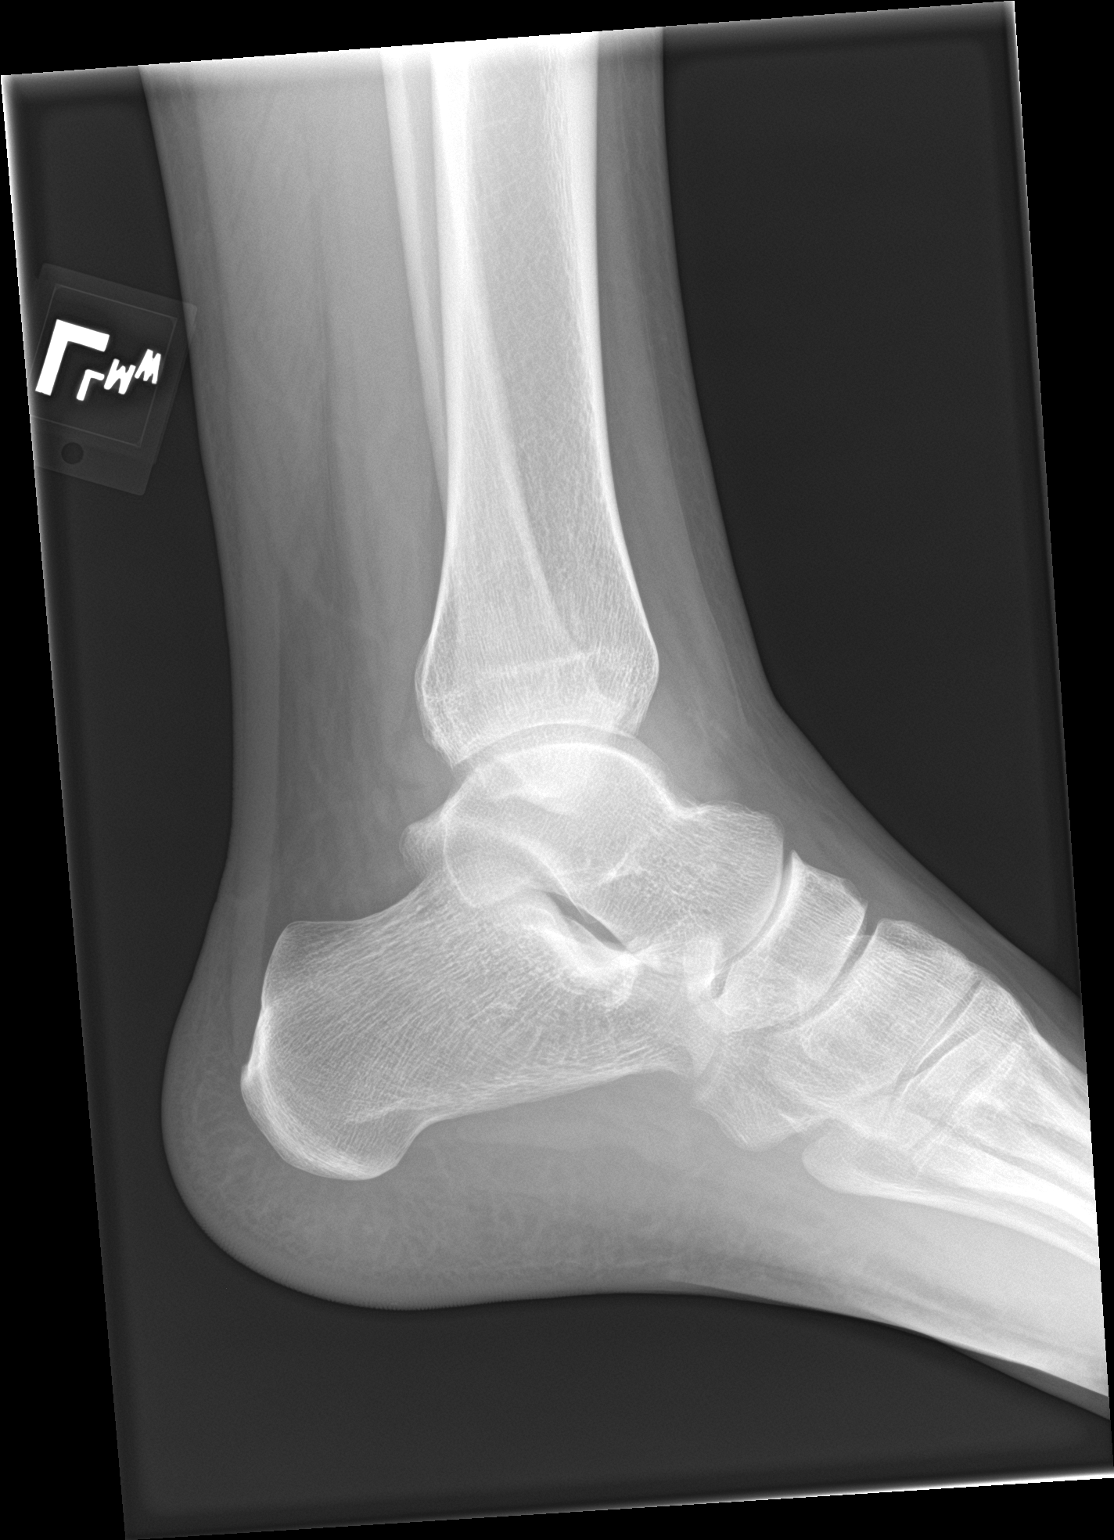

[3 of 3 positions shown; findings below may reference images not displayed]

FINDINGS: Tiny flake fracture/ avulsion off the tip of the fibula, new since
prior exam of overlying soft tissue swelling. There is no evidence
of arthropathy or other focal bone abnormality. Soft tissues are
unremarkable.
IMPRESSION: Soft tissue swelling over the lateral malleolus with tiny flake
fracture/avulsion seen off the tip of the fibula.

## 2023-04-26 ENCOUNTER — Ambulatory Visit
Admission: EM | Admit: 2023-04-26 | Discharge: 2023-04-26 | Disposition: A | Discharge status: Home / Self Care | Payer: No Typology Code available for payment source

## 2023-04-26 ENCOUNTER — Encounter: Payer: Self-pay | Admitting: Emergency Medicine

## 2023-04-26 DIAGNOSIS — T18128A Food in esophagus causing other injury, initial encounter: Secondary | ICD-10-CM

## 2023-04-26 DIAGNOSIS — W44F3XA Food entering into or through a natural orifice, initial encounter: Secondary | ICD-10-CM | POA: Insufficient documentation

## 2023-04-26 DIAGNOSIS — R1111 Vomiting without nausea: Secondary | ICD-10-CM

## 2023-04-26 NOTE — ED Triage Notes (Addendum)
Pt c/o vomiting and dysphagia. Started about a month ago. He states that since last night he can not swallow any liquids or solids down. He states it feel like it gets stuck somewhere in his chest. He states he can swallow his own saliva. Pt states he does have a reflux feeling in his chest.

## 2023-04-26 NOTE — ED Provider Notes (Signed)
[facility]    CSN: [id number] Arrival date & time: 04/26/23  1322      History   Chief Complaint Chief Complaint  Patient presents with   Emesis   Dysphagia    HPI Jacob Schmitt is a 33 y.o. male.   33 year old male patient, Jacob Schmitt, presents to urgent care for evaluation of difficulty swallowing/ emesis x 1 day.  Patient states he went to [place] last night and after eating steak felt like it got stuck in throat.  Patient states he is able to swallow his saliva but is unable to drink water or eat as he immediately vomits  The history is provided by the patient. No language interpreter was used.    History reviewed. No pertinent past medical history.  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 No pertinent surgical history.     Home Medications    Prior to Admission medications   Medication Sig Start Date End Date Taking? Authorizing Provider  azelastine (OPTIVAR) 0.05 % ophthalmic solution Place 1 drop into the left eye 2 (two) times daily. 08/21/17   Cuthriell, Delorise Royals, PA-C  cyclobenzaprine (FLEXERIL) 10 MG tablet Take 1 tablet (10 mg total) by mouth 3 (three) times daily as needed for muscle spasms. Patient not taking: Reported on 12/18/2016 11/04/15   Kem Boroughs B, FNP  HYDROcodone-acetaminophen (NORCO) 5-325 MG tablet Take 1 tablet by mouth every 4 (four) hours as needed for moderate pain. Patient not taking: Reported on 12/18/2016 09/05/16   Willy Eddy, MD  ibuprofen (ADVIL,MOTRIN) 800 MG tablet Take 1 tablet (800 mg total) by mouth every 8 (eight) hours as needed. Patient not taking: Reported on 12/18/2016 06/28/16   Enid Derry, PA-C  ketorolac (ACULAR) 0.5 % ophthalmic solution Place 1 drop into the right eye 4 (four) times daily. 08/21/17   Cuthriell, Delorise Royals, PA-C  naproxen (NAPROSYN) 500  MG tablet Take 1 tablet (500 mg total) by mouth 2 (two) times daily with a meal. Patient not taking: Reported on 12/18/2016 11/04/15   Kem Boroughs B, FNP  oxyCODONE-acetaminophen (ROXICET) 5-325 MG tablet Take 1-2 tablets by mouth every 6 (six) hours as needed. 12/18/16   Schaevitz, Myra Rude, MD  penicillin v potassium (VEETID) 500 MG tablet Take 1 tablet (500 mg total) by mouth 4 (four) times daily. 12/18/16   Schaevitz, Myra Rude, MD  predniSONE (STERAPRED UNI-PAK 21 TAB) 10 MG (21) TBPK tablet Take 6 tablets on day 1, take 5 tablets on day 2, take 4 tablets on day 3, take 3 tablets on day 4, take 2 tablets on day 5, take 1 tablet on day 6 Patient not taking: Reported on 12/18/2016 06/28/16   Enid Derry, PA-C    Family History History reviewed. No pertinent family history.  Social History Social History   Tobacco Use   Smoking status: Former   Smokeless tobacco: Never  Vaping Use   Vaping status: Never Used  Substance Use Topics   Alcohol use: Yes    Alcohol/week: 1.0 standard drink of alcohol    Types: 1 Cans of beer per week   Drug use: Yes    Types: Marijuana     Allergies   Patient has no known allergies.   Review of Systems Review of Systems  Gastrointestinal:  Positive for vomiting.       Difficulty swallowing ?  food bolus  All other systems reviewed and are negative.    Physical Exam Triage Vital Signs ED Triage Vitals  Encounter Vitals Group     BP 04/26/23 1432 (!) 136/95     Systolic BP Percentile --      Diastolic BP Percentile --      Pulse Rate 04/26/23 1432 73     Resp 04/26/23 1432 16     Temp 04/26/23 1432 98.2 F (36.8 C)     Temp Source 04/26/23 1432 Oral     SpO2 04/26/23 1432 99 %     Weight 04/26/23 1431 235 lb 0.2 oz (106.6 kg)     Height 04/26/23 1431 5\' 11"  (1.803 m)     Head Circumference --      Peak Flow --      Pain Score 04/26/23 1430 0     Pain Loc --      Pain Education --      Exclude from Growth Chart --    No  data found.  Updated Vital Signs BP (!) 136/95 (BP Location: Left Arm)   Pulse 73   Temp 98.2 F (36.8 C) (Oral)   Resp 16   Ht 5\' 11"  (1.803 m)   Wt 235 lb 0.2 oz (106.6 kg)   SpO2 99%   BMI 32.78 kg/m   Visual Acuity Right Eye Distance:   Left Eye Distance:   Bilateral Distance:    Right Eye Near:   Left Eye Near:    Bilateral Near:     Physical Exam Vitals and nursing note reviewed.  Constitutional:      Appearance: Normal appearance. He is well-developed and well-groomed.  HENT:     Head: Normocephalic.  Cardiovascular:     Rate and Rhythm: Normal rate.  Pulmonary:     Effort: Pulmonary effort is normal.  Neurological:     General: No focal deficit present.     Mental Status: He is alert and oriented to person, place, and time.     GCS: GCS eye subscore is 4. GCS verbal subscore is 5. GCS motor subscore is 6.     Cranial Nerves: No cranial nerve deficit.     Sensory: No sensory deficit.  Psychiatric:        Behavior: Behavior is cooperative.      UC Treatments / Results  Labs (all labs ordered are listed, but only abnormal results are displayed) Labs Reviewed - No data to display  EKG   Radiology No results found.  Procedures Procedures (including critical care time)  Medications Ordered in UC Medications - No data to display  Initial Impression / Assessment and Plan / UC Course  I have reviewed the triage vital signs and the nursing notes.  Pertinent labs & imaging results that were available during my care of the patient were reviewed by me and considered in my medical decision making (see chart for details).    Discussed exam findings and plan of care with patient, patient verbalized understanding to this provider will go to the emergency room for further evaluation of possible food bolus.  Ddx: Food impaction,esophageal stricture Final Clinical Impressions(s) / UC Diagnoses   Final diagnoses:  Obstruction of esophagus due to food  impaction  Vomiting without nausea, unspecified vomiting type     Discharge Instructions      Please go to ER for evaluation of food bolus,inability to swallow liquids or solids since 04/25/23     ED Prescriptions  None    PDMP not reviewed this encounter.   Clancy Gourd, NP 04/26/23 [phone]

## 2023-04-26 NOTE — ED Notes (Addendum)
Patient is being discharged from the Urgent Care and sent to the Emergency Department via POV . Per Haig Prophet, NP, patient is in need of higher level of care due to Obstruction of esophagus. Patient is aware and verbalizes understanding of plan of care.  Vitals:   04/26/23 1432  BP: (!) 136/95  Pulse: 73  Resp: 16  Temp: 98.2 F (36.8 C)  SpO2: 99%

## 2023-04-26 NOTE — Discharge Instructions (Signed)
Please go to ER for evaluation of food bolus,inability to swallow liquids or solids since 04/25/23

## 2023-10-17 ENCOUNTER — Encounter: Payer: Self-pay | Admitting: Emergency Medicine

## 2023-10-17 ENCOUNTER — Ambulatory Visit
Admission: EM | Admit: 2023-10-17 | Discharge: 2023-10-17 | Disposition: A | Discharge status: Home / Self Care | Payer: Self-pay | Attending: Family Medicine | Admitting: Family Medicine

## 2023-10-17 DIAGNOSIS — R1310 Dysphagia, unspecified: Secondary | ICD-10-CM

## 2023-10-17 MED ORDER — PANTOPRAZOLE SODIUM 40 MG PO TBEC: 40.0000 mg | DELAYED_RELEASE_TABLET | Freq: Two times a day (BID) | ORAL | 0 refills | Status: AC

## 2023-10-17 MED ORDER — BACLOFEN 10 MG PO TABS: 10.0000 mg | ORAL_TABLET | Freq: Three times a day (TID) | ORAL | 0 refills | Status: AC

## 2023-10-17 NOTE — ED Provider Notes (Signed)
 [facility]    CSN: [id number] Arrival date & time: 10/17/23  1148      History   Chief Complaint Chief Complaint  Patient presents with   Gastroesophageal Reflux    HPI Jacob Schmitt is a 33 y.o. male.   HPI  33 year old male with past medical history significant for eosinophilic esophagitis presents for evaluation of painful swallowing after eating lasagna at [employer] last night.  He reports that food and fluids do go down though it does hurt when he eats.  No nausea or vomiting.  No sore throat or sour taste in the mouth.  History reviewed. No pertinent past medical history.  Patient Active Problem List   Diagnosis Date Noted   Obstruction of esophagus due to food impaction 04/26/2023   Vomiting without nausea 04/26/2023   Food impaction of esophagus 04/26/2023    History reviewed. No pertinent surgical history.     Home Medications    Prior to Admission medications   Medication Sig Start Date End Date Taking? Authorizing Provider  baclofen (LIORESAL) 10 MG tablet Take 1 tablet (10 mg total) by mouth 3 (three) times daily. 10/17/23  Yes Bernardino Ditch, NP  azelastine  (OPTIVAR ) 0.05 % ophthalmic solution Place 1 drop into the left eye 2 (two) times daily. 08/21/17   Cuthriell, Dorn BIRCH, PA-C  ketorolac  (ACULAR ) 0.5 % ophthalmic solution Place 1 drop into the right eye 4 (four) times daily. 08/21/17   Cuthriell, Dorn BIRCH, PA-C  pantoprazole (PROTONIX) 40 MG tablet Take 1 tablet (40 mg total) by mouth 2 (two) times daily. 10/17/23   Bernardino Ditch, NP    Family History History reviewed. No pertinent family history.  Social History Social History   Tobacco Use   Smoking status: Former   Smokeless tobacco: Never  Vaping Use   Vaping status: Never Used  Substance Use Topics   Alcohol use: Yes    Alcohol/week: 1.0 standard drink of alcohol    Types: 1 Cans of beer per week   Drug use: Yes    Types: Marijuana     Allergies   Patient has  no known allergies.   Review of Systems Review of Systems  HENT:  Positive for sore throat.   Gastrointestinal:  Negative for abdominal pain, nausea and vomiting.     Physical Exam Triage Vital Signs ED Triage Vitals  Encounter Vitals Group     BP      Girls Systolic BP Percentile      Girls Diastolic BP Percentile      Boys Systolic BP Percentile      Boys Diastolic BP Percentile      Pulse      Resp      Temp      Temp src      SpO2      Weight      Height      Head Circumference      Peak Flow      Pain Score      Pain Loc      Pain Education      Exclude from Growth Chart    No data found.  Updated Vital Signs BP 124/84 (BP Location: Right Arm)   Pulse (!) 56   Temp 98.5 F (36.9 C) (Oral)   Resp 17   SpO2 97%   Visual Acuity Right Eye Distance:   Left Eye Distance:   Bilateral Distance:    Right Eye Near:  Left Eye Near:    Bilateral Near:     Physical Exam Vitals and nursing note reviewed.  Constitutional:      Appearance: Normal appearance. He is not ill-appearing.  HENT:     Head: Normocephalic and atraumatic.     Mouth/Throat:     Mouth: Mucous membranes are moist.     Pharynx: Oropharynx is clear. No oropharyngeal exudate or posterior oropharyngeal erythema.  Abdominal:     General: Abdomen is flat.     Palpations: Abdomen is soft.     Tenderness: There is no abdominal tenderness. There is no guarding or rebound.  Skin:    General: Skin is warm and dry.     Capillary Refill: Capillary refill takes less than 2 seconds.     Findings: No rash.  Neurological:     General: No focal deficit present.     Mental Status: He is alert and oriented to person, place, and time.      UC Treatments / Results  Labs (all labs ordered are listed, but only abnormal results are displayed) Labs Reviewed  GROUP A STREP BY PCR    EKG   Radiology No results found.  Procedures Procedures (including critical care time)  Medications Ordered  in UC Medications - No data to display  Initial Impression / Assessment and Plan / UC Course  I have reviewed the triage vital signs and the nursing notes.  Pertinent labs & imaging results that were available during my care of the patient were reviewed by me and considered in my medical decision making (see chart for details).   Patient is a nontoxic-appearing 33 year old male presenting for evaluation of dysphagia after eating lasagna last night at [employer].  The patient is followed by [facility] GI medicine and had an upper endoscopy on 04/27/2023 that was determined after a esophageal food bolus impaction.  Subsequent pathology showed eosinophilic esophagitis.  When reviewing the clinic notes from 06/05/2023 it indicates that the patient had been on twice daily pantoprazole 40 mg prior to endoscopy and with resolution of symptoms he had decreased the pantoprazole to 40 mg once daily.  He was supposed to have a repeat EGD in 8 weeks though I do not see any subsequent clinic notes.  He is not having any difficulty swallowing at present just painful swallowing.  I will have him increase his pantoprazole back to 40 mg twice daily and add on 10 mg baclofen to help with esophageal spasm.  He needs to call [facility] to make an appointment for evaluation and for repeat endoscopy.  ER precautions reviewed.  Work note provided.   Final Clinical Impressions(s) / UC Diagnoses   Final diagnoses:  Dysphagia, unspecified type     Discharge Instructions      Resume taking your pantoprazole 40 mg twice daily to help control your painful swallowing.  I am going to add on 10 mg baclofen, a nonsedating muscle laxer, that you can take every 8 hours.  This will help decrease to the tone of your esophagus to make painful swallowing less uncomfortable.  You need to call [facility] at [facility] and make a follow-up appointment to be evaluated.  Per the clinic note from 06/05/2023 you were supposed to have a repeat EGD in 8  weeks.  If you develop another food bolus obstruction, feel as though food is not going down, or unable to swallow, or develop choking, you need to go to the ER for evaluation.  ED Prescriptions     Medication Sig Dispense Auth. Provider   pantoprazole (PROTONIX) 40 MG tablet Take 1 tablet (40 mg total) by mouth 2 (two) times daily. 60 tablet Bernardino Ditch, NP   baclofen (LIORESAL) 10 MG tablet Take 1 tablet (10 mg total) by mouth 3 (three) times daily. 30 each Bernardino Ditch, NP      PDMP not reviewed this encounter.   Bernardino Ditch, NP 10/17/23 1220

## 2023-10-17 NOTE — Discharge Instructions (Signed)
 Resume taking your pantoprazole 40 mg twice daily to help control your painful swallowing.  I am going to add on 10 mg baclofen, a nonsedating muscle laxer, that you can take every 8 hours.  This will help decrease to the tone of your esophagus to make painful swallowing less uncomfortable.  You need to call [facility] and make a follow-up appointment to be evaluated.  Per the clinic note from 06/05/2023 you were supposed to have a repeat EGD in 8 weeks.  If you develop another food bolus obstruction, feel as though food is not going down, or unable to swallow, or develop choking, you need to go to the ER for evaluation.

## 2023-10-17 NOTE — ED Triage Notes (Signed)
 Patient states that he ate some lasagna last night at [employer] and started experiencing pain in his esophagus after he ate it.

## 2023-12-30 ENCOUNTER — Ambulatory Visit
Admission: EM | Admit: 2023-12-30 | Discharge: 2023-12-30 | Disposition: A | Discharge status: Home / Self Care | Payer: Self-pay | Attending: Physician Assistant | Admitting: Physician Assistant

## 2023-12-30 DIAGNOSIS — B356 Tinea cruris: Secondary | ICD-10-CM | POA: Insufficient documentation

## 2023-12-30 DIAGNOSIS — N50812 Left testicular pain: Secondary | ICD-10-CM | POA: Insufficient documentation

## 2023-12-30 DIAGNOSIS — R1030 Lower abdominal pain, unspecified: Secondary | ICD-10-CM | POA: Insufficient documentation

## 2023-12-30 LAB — URINALYSIS, W/ REFLEX TO CULTURE (INFECTION SUSPECTED)
Bacteria, UA: NONE SEEN
Bilirubin Urine: NEGATIVE
Glucose, UA: NEGATIVE mg/dL
Hgb urine dipstick: NEGATIVE
Ketones, ur: NEGATIVE mg/dL
Leukocytes,Ua: NEGATIVE
Nitrite: NEGATIVE
Protein, ur: NEGATIVE mg/dL
Specific Gravity, Urine: 1.02 (ref 1.005–1.030)
pH: 7 (ref 5.0–8.0)

## 2023-12-30 MED ORDER — CLOTRIMAZOLE 1 % EX CREA: TOPICAL_CREAM | CUTANEOUS | 0 refills | Status: AC

## 2023-12-30 NOTE — ED Triage Notes (Signed)
 Pt c/o L sided groin pain x8 days. Also c/o irritation that started today. Denies any urinary sx's or injuries.

## 2023-12-30 NOTE — ED Provider Notes (Signed)
 [facility]    CSN: [id number] Arrival date & time: 12/30/23  1525      History   Chief Complaint Chief Complaint  Patient presents with   Groin Pain    HPI Jacob Schmitt is a 33 y.o. male presenting for approximately 8-day history of left testicle pain.  He says the skin started to feel irritated today.  He denies dysuria, frequency, urgency, hematuria or penile discharge.  He reports seeing something in the urine which might have been discharge a month ago but has not happened since.  He denies any concern for STIs.  States he has been with the same partner for the last 11 to 12 years and she does not have any other partners.  No history of hernias.  No OTC meds.  HPI  History reviewed. No pertinent past medical history.  Patient Active Problem List   Diagnosis Date Noted   Obstruction of esophagus due to food impaction 04/26/2023   Vomiting without nausea 04/26/2023   Food impaction of esophagus 04/26/2023    History reviewed. No pertinent surgical history.     Home Medications    Prior to Admission medications   Medication Sig Start Date End Date Taking? Authorizing Provider  clotrimazole (LOTRIMIN) 1 % cream Apply to affected area 2 times daily x 1-2 week 12/30/23  Yes Arvis Jolan NOVAK, PA-C  pantoprazole  (PROTONIX ) 40 MG tablet Take 1 tablet (40 mg total) by mouth daily before breakfast. 10/29/23 01/27/24 Yes [provider]  azelastine  (OPTIVAR ) 0.05 % ophthalmic solution Place 1 drop into the left eye 2 (two) times daily. 08/21/17   Cuthriell, Dorn BIRCH, PA-C  baclofen  (LIORESAL ) 10 MG tablet Take 1 tablet (10 mg total) by mouth 3 (three) times daily. 10/17/23   Bernardino Ditch, NP  ketorolac  (ACULAR ) 0.5 % ophthalmic solution Place 1 drop into the right eye 4 (four) times daily. 08/21/17   Cuthriell, Dorn BIRCH, PA-C  pantoprazole  (PROTONIX ) 40 MG tablet Take 1 tablet (40 mg total) by mouth 2 (two) times daily. 10/17/23   Bernardino Ditch, NP     Family History History reviewed. No pertinent family history.  Social History Social History   Tobacco Use   Smoking status: Former   Smokeless tobacco: Never  Vaping Use   Vaping status: Never Used  Substance Use Topics   Alcohol use: Yes    Alcohol/week: 1.0 standard drink of alcohol    Types: 1 Cans of beer per week   Drug use: Yes    Types: Marijuana     Allergies   Patient has no known allergies.   Review of Systems Review of Systems  Constitutional:  Negative for fatigue and fever.  HENT:  Negative for sore throat.   Respiratory:  Negative for cough.   Gastrointestinal:  Negative for abdominal pain, nausea and vomiting.  Genitourinary:  Positive for penile pain and testicular pain. Negative for dysuria, frequency, genital sores, hematuria, penile discharge, penile swelling, scrotal swelling and urgency.  Musculoskeletal:  Negative for arthralgias.  Skin:  Negative for rash.  Neurological:  Negative for weakness.     Physical Exam Triage Vital Signs ED Triage Vitals  Encounter Vitals Group     BP 12/30/23 1543 (!) 136/94     Girls Systolic BP Percentile --      Girls Diastolic BP Percentile --      Boys Systolic BP Percentile --      Boys Diastolic BP Percentile --  Pulse Rate 12/30/23 1543 65     Resp 12/30/23 1543 16     Temp 12/30/23 1543 98 F (36.7 C)     Temp Source 12/30/23 1543 Oral     SpO2 12/30/23 1543 98 %     Weight 12/30/23 1542 268 lb (121.6 kg)     Height 12/30/23 1542 5' 10 (1.778 m)     Head Circumference --      Peak Flow --      Pain Score 12/30/23 1547 8     Pain Loc --      Pain Education --      Exclude from Growth Chart --    No data found.  Updated Vital Signs BP (!) 136/94 (BP Location: Left Arm)   Pulse 65   Temp 98 F (36.7 C) (Oral)   Resp 16   Ht 5' 10 (1.778 m)   Wt 268 lb (121.6 kg)   SpO2 98%   BMI 38.45 kg/m    Physical Exam Vitals and nursing note reviewed. Exam conducted with a chaperone  present [place], [place]).  Constitutional:      General: He is not in acute distress.    Appearance: Normal appearance. He is well-developed. He is not ill-appearing.  HENT:     Head: Normocephalic and atraumatic.  Eyes:     General: No scleral icterus.    Conjunctiva/sclera: Conjunctivae normal.  Cardiovascular:     Rate and Rhythm: Normal rate and regular rhythm.  Pulmonary:     Effort: Pulmonary effort is normal. No respiratory distress.     Breath sounds: Normal breath sounds.  Abdominal:     Palpations: Abdomen is soft.     Tenderness: There is no abdominal tenderness.     Hernia: There is no hernia in the left inguinal area or right inguinal area.  Genitourinary:    Penis: Normal.      Testes:        Right: Tenderness or swelling not present.        Left: Tenderness or swelling not present.     Epididymis:     Right: Normal.     Left: Normal.     Comments: Faint erythematous rash ventral shaft and scrotum Musculoskeletal:     Cervical back: Neck supple.  Skin:    General: Skin is warm and dry.     Capillary Refill: Capillary refill takes less than 2 seconds.  Neurological:     General: No focal deficit present.     Mental Status: He is alert. Mental status is at baseline.     Motor: No weakness.     Gait: Gait normal.  Psychiatric:        Mood and Affect: Mood normal.        Behavior: Behavior normal.      UC Treatments / Results  Labs (all labs ordered are listed, but only abnormal results are displayed) Labs Reviewed  URINALYSIS, W/ REFLEX TO CULTURE (INFECTION SUSPECTED)    EKG   Radiology No results found.  Procedures Procedures (including critical care time)  Medications Ordered in UC Medications - No data to display  Initial Impression / Assessment and Plan / UC Course  I have reviewed the triage vital signs and the nursing notes.  Pertinent labs & imaging results that were available during my care of the patient were reviewed by me and  considered in my medical decision making (see chart for details).   33 year old male presents for  8-day history of left testicle pain without swelling. No urinary symptoms. No concern for STIs. Declined STI testing.  On exam, he has very mild erythematous rash of scrotum and ventral shaft of penis which may be consistent with fungal infection. No swelling or tenderness. No hernias noted.  UA obtained. Normal.   Tinea. Sent clotrimazole. Advised tylenol , motrin , scrotum elevation and ice for comfort. If not improving soon or symptoms worsen he should follow up with PCP. ED precautions discussed.    Final Clinical Impressions(s) / UC Diagnoses   Final diagnoses:  Pain in left testicle  Tinea cruris     Discharge Instructions      -No UTI -You have a fungal infection. Use the topical cream and keep area clean and dry -Take Tylenol /Motrin  for pain as needed, ice and elevate scrotum -If symptoms not improving in a few days or if they worsen follow up with PCP.  -If pain significantly worsens, you have increased swelling, fever, etc go to ER for consideration of emergent scrotum ultrasound.     ED Prescriptions     Medication Sig Dispense Auth. Provider   clotrimazole (LOTRIMIN) 1 % cream Apply to affected area 2 times daily x 1-2 week 42 g Arvis Jolan NOVAK, PA-C      PDMP not reviewed this encounter.   Arvis Jolan NOVAK, PA-C 12/30/23 [phone]

## 2023-12-30 NOTE — Discharge Instructions (Addendum)
-  No UTI -You have a fungal infection. Use the topical cream and keep area clean and dry -Take Tylenol /Motrin  for pain as needed, ice and elevate scrotum -If symptoms not improving in a few days or if they worsen follow up with PCP.  -If pain significantly worsens, you have increased swelling, fever, etc go to ER for consideration of emergent scrotum ultrasound.

## 2024-01-22 ENCOUNTER — Telehealth: Payer: Self-pay | Admitting: Physician Assistant

## 2024-01-22 ENCOUNTER — Encounter: Payer: Self-pay | Admitting: *Deleted

## 2024-01-22 ENCOUNTER — Ambulatory Visit
Admit: 2024-01-22 | Discharge: 2024-01-22 | Disposition: A | Discharge status: Home / Self Care | Payer: Self-pay | Attending: Family Medicine | Admitting: Family Medicine

## 2024-01-22 ENCOUNTER — Ambulatory Visit
Admission: EM | Admit: 2024-01-22 | Discharge: 2024-01-22 | Disposition: A | Discharge status: Home / Self Care | Payer: Self-pay | Attending: Physician Assistant | Admitting: Physician Assistant

## 2024-01-22 DIAGNOSIS — I861 Scrotal varices: Secondary | ICD-10-CM | POA: Insufficient documentation

## 2024-01-22 DIAGNOSIS — N50812 Left testicular pain: Secondary | ICD-10-CM | POA: Insufficient documentation

## 2024-01-22 DIAGNOSIS — Z113 Encounter for screening for infections with a predominantly sexual mode of transmission: Secondary | ICD-10-CM | POA: Insufficient documentation

## 2024-01-22 DIAGNOSIS — N503 Cyst of epididymis: Secondary | ICD-10-CM | POA: Insufficient documentation

## 2024-01-22 DIAGNOSIS — R21 Rash and other nonspecific skin eruption: Secondary | ICD-10-CM | POA: Insufficient documentation

## 2024-01-22 MED ORDER — FLUCONAZOLE 150 MG PO TABS: ORAL_TABLET | ORAL | 0 refills | Status: AC

## 2024-01-22 MED ORDER — NYSTATIN 100000 UNIT/GM EX CREA: TOPICAL_CREAM | CUTANEOUS | 0 refills | Status: AC

## 2024-01-22 NOTE — Discharge Instructions (Addendum)
-   Turn at 3 PM to have scrotal ultrasound and I will call you with the results. - I sent more antifungal medication to pharmacy.  Go ahead and take that. - The STI results will be back it within a couple days and we will call you with any positives. - We discussed I may put a referral into urology for you.

## 2024-01-22 NOTE — Telephone Encounter (Signed)
 Discussed ultrasound results with patient.  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  Will place urology referral for further evaluation and monitoring since he has been bothered by the discomfort for the past month.

## 2024-01-22 NOTE — ED Triage Notes (Signed)
 Pt states when using the cream from last OV the rash on his scrotum resolved but once finished treatment the rash returned. He states he has left sided scrotal pain when washing the area.

## 2024-01-22 NOTE — ED Provider Notes (Signed)
 [facility]    CSN: [id number] Arrival date & time: 01/22/24  1130      History   Chief Complaint No chief complaint on file.   HPI Jacob Schmitt is a 33 y.o. male presenting for approximately scrotal rash and left testicle pain.  Reports skin irritation. Jacob Schmitt denies dysuria, frequency, urgency, hematuria or penile discharge. Jacob Schmitt denies any concern for STIs.  States Jacob Schmitt has been with the same partner for the last 11 to 12 years and she does not have any other partners. Open to STI testing today.  No history of hernias.  No OTC meds. Patient was seen here 3 weeks ago by me and treated with topical clotrimazole for jock itch. Jacob Schmitt says this temporarily resolved his symptoms.   HPI  History reviewed. No pertinent past medical history.  Patient Active Problem List   Diagnosis Date Noted   Obstruction of esophagus due to food impaction 04/26/2023   Vomiting without nausea 04/26/2023   Food impaction of esophagus 04/26/2023    History reviewed. No pertinent surgical history.     Home Medications    Prior to Admission medications   Medication Sig Start Date End Date Taking? Authorizing Provider  fluconazole (DIFLUCAN) 150 MG tablet Take 1 tab p.o. every 72 hours 01/22/24  Yes Arvis Huxley B, PA-C  nystatin cream (MYCOSTATIN) Apply to affected area 2 times daily 01/22/24  Yes Arvis Huxley B, PA-C  pantoprazole  (PROTONIX ) 40 MG tablet Take 1 tablet (40 mg total) by mouth 2 (two) times daily. 10/17/23  Yes Bernardino Ditch, NP  azelastine  (OPTIVAR ) 0.05 % ophthalmic solution Place 1 drop into the left eye 2 (two) times daily. 08/21/17   Cuthriell, Dorn BIRCH, PA-C  baclofen  (LIORESAL ) 10 MG tablet Take 1 tablet (10 mg total) by mouth 3 (three) times daily. 10/17/23   Bernardino Ditch, NP  clotrimazole (LOTRIMIN) 1 % cream Apply to affected area 2 times daily x 1-2 week 12/30/23   Arvis Huxley B, PA-C  ketorolac  (ACULAR ) 0.5 % ophthalmic solution Place 1 drop into the right eye 4  (four) times daily. 08/21/17   Cuthriell, Dorn BIRCH, PA-C  pantoprazole  (PROTONIX ) 40 MG tablet Take 1 tablet (40 mg total) by mouth daily before breakfast. 10/29/23 01/27/24  [provider]    Family History History reviewed. No pertinent family history.  Social History Social History   Tobacco Use   Smoking status: Former   Smokeless tobacco: Never  Vaping Use   Vaping status: Never Used  Substance Use Topics   Alcohol use: Yes    Alcohol/week: 1.0 standard drink of alcohol    Types: 1 Cans of beer per week   Drug use: Yes    Types: Marijuana     Allergies   Patient has no known allergies.   Review of Systems Review of Systems  Constitutional:  Negative for fatigue and fever.  HENT:  Negative for sore throat.   Respiratory:  Negative for cough.   Gastrointestinal:  Negative for abdominal pain, nausea and vomiting.  Genitourinary:  Positive for penile pain and testicular pain. Negative for dysuria, frequency, genital sores, hematuria, penile discharge, penile swelling, scrotal swelling and urgency.  Musculoskeletal:  Negative for arthralgias.  Skin:  Negative for rash.  Neurological:  Negative for weakness.     Physical Exam Triage Vital Signs  No data found.  Updated Vital Signs BP 131/88 (BP Location: Left Arm)   Pulse 72   Temp 98 F (36.7 C) (Oral)  Resp 16   SpO2 98%    Physical Exam Vitals and nursing note reviewed. Exam conducted with a chaperone present Shirleyann, MA).  Constitutional:      General: Jacob Schmitt is not in acute distress.    Appearance: Normal appearance. Jacob Schmitt is well-developed. Jacob Schmitt is not ill-appearing.  HENT:     Head: Normocephalic and atraumatic.  Eyes:     General: No scleral icterus.    Conjunctiva/sclera: Conjunctivae normal.  Cardiovascular:     Rate and Rhythm: Normal rate and regular rhythm.  Pulmonary:     Effort: Pulmonary effort is normal. No respiratory distress.     Breath sounds: Normal breath sounds.  Abdominal:      Palpations: Abdomen is soft.     Tenderness: There is no abdominal tenderness.     Hernia: There is no hernia in the left inguinal area or right inguinal area.  Genitourinary:    Penis: Normal.      Testes:        Right: Tenderness or swelling not present.        Left: Tenderness or swelling not present.     Epididymis:     Right: Normal.     Left: Normal.     Comments: Faint erythematous rash ventral shaft and scrotum Musculoskeletal:     Cervical back: Neck supple.  Skin:    General: Skin is warm and dry.     Capillary Refill: Capillary refill takes less than 2 seconds.  Neurological:     General: No focal deficit present.     Mental Status: Jacob Schmitt is alert. Mental status is at baseline.     Motor: No weakness.     Gait: Gait normal.  Psychiatric:        Mood and Affect: Mood normal.        Behavior: Behavior normal.      UC Treatments / Results  Labs (all labs ordered are listed, but only abnormal results are displayed) Labs Reviewed  CYTOLOGY, (ORAL, ANAL, URETHRAL) ANCILLARY ONLY     EKG   Radiology US  SCROTUM W/DOPPLER Result Date: 01/22/2024 EXAM: ULTRASOUND SCROTUM/TESTICLES WITH DOPPLER FLOW EVALUATION 01/22/2024 03:54:31 PM TECHNIQUE: Duplex ultrasound using B-mode/gray scaled imaging, Doppler spectral analysis and color flow Doppler was obtained of the testicles. COMPARISON: Ultrasound 11/05/2011. CLINICAL HISTORY: FINDINGS: RIGHT: MEASUREMENTS: 4.2 x 2.8 x 3.2 cm. GREY SCALE: The right testicle demonstrates normal homogeneous echotexture without focal lesion. No testicular microlithiasis. DOPPLER EVALUATION: There is normal arterial and venous Doppler flow within the testicle. VARICOCELE: No scrotal varicocele. SCROTAL SAC: Trace hydroceles. EPIDIDYMIS: No acute abnormality. LEFT: MEASUREMENTS: 4.2 x 2.8 x 2.7 cm. GREY SCALE: The left testicle demonstrates normal homogeneous echotexture without focal lesion. Few testicular microcalcifications. DOPPLER  EVALUATION: There is normal arterial and venous Doppler flow within the testicle. VARICOCELE: Small left varicocele. SCROTAL SAC: Trace hydroceles. EPIDIDYMIS: Left epididymal cysts measuring up to 11 mm. IMPRESSION: 1. Negative for testicular torsion or intratesticular mass. 2. Left epididymal cysts up to 11 mm. 3. Trace bilateral hydroceles. 4. Small left varicocele. Electronically signed by: Luke Bun MD 01/22/2024 04:06 PM EDT RP Workstation: [id number]    Procedures Procedures (including critical care time)  Medications Ordered in UC Medications - No data to display  Initial Impression / Assessment and Plan / UC Course  I have reviewed the triage vital signs and the nursing notes.  Pertinent labs & imaging results that were available during my care of the patient were reviewed by me and considered  in my medical decision making (see chart for details).   33 year old male presents for scrotal rash and left testicle pain without swelling. No urinary symptoms. No concern for STIs. Open to STI testing.  Treated a few weeks ago with clotrimazole which temporarily resolved symptoms until Jacob Schmitt completed the treatment.  On exam, Jacob Schmitt has very mild erythematous rash of scrotum and ventral shaft of penis which may be consistent with fungal infection. No swelling or tenderness. No hernias noted.  Will order scrotal US  today Cytology swab for STI testing obtained.   Tinea. Sent Nystatin and Diflucan. Advised tylenol , motrin , scrotum elevation and ice for comfort. If not improving soon or symptoms worsen Jacob Schmitt should follow up with PCP. ED precautions discussed.   Ultrasound results show left epididymal cysts up to 11 mm, trace bilateral hydroceles and small left varicocele.  Results discussed with patient.  Will place referral to urology for the large epididymal cyst for further monitoring and evaluation.  May consider drainage if symptomatic or enlarging.   Final Clinical Impressions(s) / UC Diagnoses    Final diagnoses:  Left testicular pain  Rash and nonspecific skin eruption  Epididymal cyst     Discharge Instructions      - Turn at 3 PM to have scrotal ultrasound and I will call you with the results. - I sent more antifungal medication to pharmacy.  Go ahead and take that. - The STI results will be back it within a couple days and we will call you with any positives. - We discussed I may put a referral into urology for you.      ED Prescriptions     Medication Sig Dispense Auth. Provider   nystatin cream (MYCOSTATIN) Apply to affected area 2 times daily 30 g Arvis, Jaquavion Mccannon B, PA-C   fluconazole (DIFLUCAN) 150 MG tablet Take 1 tab p.o. every 72 hours 3 tablet Arvis Jolan NOVAK, PA-C      PDMP not reviewed this encounter.      Arvis Jolan B, PA-C 01/22/24 [id number]

## 2024-01-23 ENCOUNTER — Ambulatory Visit (HOSPITAL_COMMUNITY): Payer: Self-pay

## 2024-01-23 LAB — CYTOLOGY, (ORAL, ANAL, URETHRAL) ANCILLARY ONLY
Chlamydia: NEGATIVE
Comment: NEGATIVE
Comment: NEGATIVE
Comment: NORMAL
Neisseria Gonorrhea: NEGATIVE
Trichomonas: NEGATIVE

## 2024-01-27 ENCOUNTER — Other Ambulatory Visit: Payer: Self-pay

## 2024-01-27 DIAGNOSIS — N50812 Left testicular pain: Secondary | ICD-10-CM

## 2024-01-27 DIAGNOSIS — I861 Scrotal varices: Secondary | ICD-10-CM | POA: Insufficient documentation

## 2024-01-27 DIAGNOSIS — N503 Cyst of epididymis: Secondary | ICD-10-CM | POA: Insufficient documentation

## 2024-01-27 NOTE — Assessment & Plan Note (Signed)
 Multiple Left epididymal cysts, up to 11mm  Benign incidental finding.  These are not responsible for his pain syndrome.  Although I do believe he has underlying left epididymitis, as below.

## 2024-01-27 NOTE — Assessment & Plan Note (Signed)
 Small subclinical Left varicocele   - incidental on recent US   Varicocele is a dilatation of the spermatic cord veins, diagnosed by physical exam and/or confirmation with scrotal US . It can contribute to chronic scrotal pain syndromes, though many remain incidental and asymptomatic. Management ranges from conservative measures to surgical repair for classic, persistent pain, with microsurgical subinguinal repair showing the highest success rates. Varicocele repair is considered in infertile men with abnormal semen analyses, but not routinely performed in healthy men for this indication alone.    - no further workup required

## 2024-01-27 NOTE — Progress Notes (Unsigned)
 01/28/24 11:08 AM   Jacob Schmitt 1990/12/18 [id number]   HPI: 33 y.o. male here for initial evaluation of epididymal cyst ED visit (01/22/24) - Left scrotal pain, jock itch  - previous ED visit on 10/7 for same, jock itch  1 month history of left scrotal pain Discomfort is focal at left superior hemiscrotum Never occurred before denies trauma or injury     PMH: No past medical history on file.  Surgical History: No past surgical history on file.  Family History: No family history on file.  Social History:  reports that he has quit smoking. He has never used smokeless tobacco. He reports current alcohol use of about 1.0 standard drink of alcohol per week. He reports current drug use. Drug: Marijuana.      Physical Exam: BP (!) 145/102 (BP Location: Left Arm, Patient Position: Sitting, Cuff Size: Large)   Pulse 87   Wt 270 lb (122.5 kg)   SpO2 97%   BMI 38.74 kg/m    Constitutional:  Alert and oriented, No acute distress. Cardiovascular: No clubbing, cyanosis, or edema. Respiratory: Normal respiratory effort, no increased work of breathing. GI: Nondistended GU: Circumcised phallus, normal penile shaft.  Patient localizes discomfort to left epididymal head.  Bilateral testicles, no masses.  Focal tenderness to palpation at left epididymis.  I am able to appreciate a small left varicocele as well as a left epididymal head cyst.  Both benign. Skin: No rashes, bruises or suspicious lesions. Neurologic: Grossly intact, no focal deficits, moving all 4 extremities. Psychiatric: Normal mood and affect.  Laboratory Data: UA (12/30/23) - negative   Pertinent Imaging: I have personally viewed and interpreted the Scrotal US  (01/22/24) -   IMPRESSION: 1. Negative for testicular torsion or intratesticular mass. 2. Left epididymal cysts up to 11 mm. 3. Trace bilateral hydroceles. 4. Small left varicocele..    Assessment & Plan:    Epididymal cyst Assessment &  Plan: Multiple Left epididymal cysts, up to 11mm  Benign incidental finding.  These are not responsible for his pain syndrome.  Although I do believe he has underlying left epididymitis, as below.   Left varicocele Assessment & Plan: Small subclinical Left varicocele   - incidental on recent US   Varicocele is a dilatation of the spermatic cord veins, diagnosed by physical exam and/or confirmation with scrotal US . It can contribute to chronic scrotal pain syndromes, though many remain incidental and asymptomatic. Management ranges from conservative measures to surgical repair for classic, persistent pain, with microsurgical subinguinal repair showing the highest success rates. Varicocele repair is considered in infertile men with abnormal semen analyses, but not routinely performed in healthy men for this indication alone.    - no further workup required   Epididymitis, left Assessment & Plan: Subacute left epididymitis, inflammatory, x 1 month  We reviewed the common causes of scrotal pain and the basic tenets of management. For many patients, symptoms are self-limited and respond to conservative management. Initial strategies include NSAIDs, scrotal support, ice packs, and/or warm sitz baths. Identifying exacerbating triggers, modification of activities and/or premedication can help reduce episodes and severity. For chronic syndromes, we may consider pelvic floor physical therapy or referral to a chronic pain specialist. In select cases, we may consider surgical treatment with anesthetic cord blocks +/- spermatic cord denervation.   - Prescribed 21-day course of Mobic 7.5 mg twice daily -Tylenol , ice packs as needed -Offered reassurance, symptoms may recur, emphasized strategies to identify triggers and prevent/reduce flares if they return  Orders: -     Meloxicam; Take 1 tablet (7.5 mg total) by mouth in the morning and at bedtime for 21 days.  Dispense: 42 tablet; Refill: 0       Jacob Skye, MD 01/28/2024  [facility] [address] [place], [place] [address] [phone]

## 2024-01-28 ENCOUNTER — Other Ambulatory Visit
Admission: RE | Admit: 2024-01-28 | Discharge: 2024-01-28 | Disposition: A | Discharge status: Home / Self Care | Payer: Self-pay | Attending: Urology | Admitting: Urology

## 2024-01-28 ENCOUNTER — Ambulatory Visit (INDEPENDENT_AMBULATORY_CARE_PROVIDER_SITE_OTHER): Payer: Self-pay | Admitting: Urology

## 2024-01-28 VITALS — BP 145/102 | HR 87 | Wt 270.0 lb

## 2024-01-28 DIAGNOSIS — I861 Scrotal varices: Secondary | ICD-10-CM

## 2024-01-28 DIAGNOSIS — N451 Epididymitis: Secondary | ICD-10-CM | POA: Insufficient documentation

## 2024-01-28 DIAGNOSIS — N503 Cyst of epididymis: Secondary | ICD-10-CM

## 2024-01-28 DIAGNOSIS — N50812 Left testicular pain: Secondary | ICD-10-CM | POA: Insufficient documentation

## 2024-01-28 LAB — URINALYSIS, COMPLETE (UACMP) WITH MICROSCOPIC
Bilirubin Urine: NEGATIVE
Glucose, UA: NEGATIVE mg/dL
Hgb urine dipstick: NEGATIVE
Ketones, ur: NEGATIVE mg/dL
Leukocytes,Ua: NEGATIVE
Nitrite: NEGATIVE
Protein, ur: NEGATIVE mg/dL
Specific Gravity, Urine: 1.021 (ref 1.005–1.030)
Squamous Epithelial / HPF: 0 /HPF (ref 0–5)
pH: 6 (ref 5.0–8.0)

## 2024-01-28 MED ORDER — MELOXICAM 7.5 MG PO TABS: 7.5000 mg | ORAL_TABLET | Freq: Two times a day (BID) | ORAL | 0 refills | Status: DC

## 2024-01-28 NOTE — Assessment & Plan Note (Signed)
 Subacute left epididymitis, inflammatory, x 1 month  We reviewed the common causes of scrotal pain and the basic tenets of management. For many patients, symptoms are self-limited and respond to conservative management. Initial strategies include NSAIDs, scrotal support, ice packs, and/or warm sitz baths. Identifying exacerbating triggers, modification of activities and/or premedication can help reduce episodes and severity. For chronic syndromes, we may consider pelvic floor physical therapy or referral to a chronic pain specialist. In select cases, we may consider surgical treatment with anesthetic cord blocks +/- spermatic cord denervation.   - Prescribed 21-day course of Mobic 7.5 mg twice daily -Tylenol , ice packs as needed -Offered reassurance, symptoms may recur, emphasized strategies to identify triggers and prevent/reduce flares if they return

## 2024-02-16 ENCOUNTER — Ambulatory Visit
Admission: EM | Admit: 2024-02-16 | Discharge: 2024-02-16 | Disposition: A | Discharge status: Home / Self Care | Payer: Self-pay | Attending: Family Medicine | Admitting: Family Medicine

## 2024-02-16 ENCOUNTER — Ambulatory Visit (INDEPENDENT_AMBULATORY_CARE_PROVIDER_SITE_OTHER): Payer: Self-pay

## 2024-02-16 DIAGNOSIS — S99921A Unspecified injury of right foot, initial encounter: Secondary | ICD-10-CM

## 2024-02-16 DIAGNOSIS — S99911A Unspecified injury of right ankle, initial encounter: Secondary | ICD-10-CM

## 2024-02-16 DIAGNOSIS — N451 Epididymitis: Secondary | ICD-10-CM

## 2024-02-16 MED ORDER — MELOXICAM 7.5 MG PO TABS: 7.5000 mg | ORAL_TABLET | Freq: Two times a day (BID) | ORAL | 0 refills | Status: AC

## 2024-02-16 MED ORDER — IBUPROFEN 800 MG PO TABS
800.0000 mg | ORAL_TABLET | Freq: Once | ORAL | Status: AC
  Administered 2024-02-16: 800 mg via ORAL

## 2024-02-16 NOTE — ED Triage Notes (Signed)
 Pt c/o R ankle pain x2 days. States was jumping over a fire & landed on ankle. Has tried OTC meds w/o relief.

## 2024-02-16 NOTE — ED Provider Notes (Signed)
 [facility]    CSN: [id number] Arrival date & time: 02/16/24  9177      History   Chief Complaint Chief Complaint  Patient presents with   Ankle Pain    HPI  HPI Jacob Schmitt is a 33 y.o. male.   Jacob Schmitt presents for right ankle pain that started 2 days ago after trying to jump over a fire. He landed right foot first and his ankle rolled and he had immediate pain and swelling. He has been icing the ankle.  Took ibuprofen  and Tylenol .  He has injured this ankle many many times but has not broken the bones yet.          History reviewed. No pertinent past medical history.  Patient Active Problem List   Diagnosis Date Noted   Epididymitis, left 01/28/2024   Epididymal cyst 01/27/2024   Left varicocele 01/27/2024   Obstruction of esophagus due to food impaction 04/26/2023   Vomiting without nausea 04/26/2023   Food impaction of esophagus 04/26/2023    History reviewed. No pertinent surgical history.     Home Medications    Prior to Admission medications   Medication Sig Start Date End Date Taking? Authorizing Provider  pantoprazole  (PROTONIX ) 40 MG tablet Take 1 tablet (40 mg total) by mouth daily before breakfast. 10/29/23 02/16/24 Yes [provider]  azelastine  (OPTIVAR ) 0.05 % ophthalmic solution Place 1 drop into the left eye 2 (two) times daily. 08/21/17   Cuthriell, Dorn BIRCH, PA-C  baclofen  (LIORESAL ) 10 MG tablet Take 1 tablet (10 mg total) by mouth 3 (three) times daily. 10/17/23   Bernardino Ditch, NP  clotrimazole  (LOTRIMIN ) 1 % cream Apply to affected area 2 times daily x 1-2 week 12/30/23   Arvis Huxley B, PA-C  fluconazole  (DIFLUCAN ) 150 MG tablet Take 1 tab p.o. every 72 hours 01/22/24   Arvis Huxley NOVAK, PA-C  ketorolac  (ACULAR ) 0.5 % ophthalmic solution Place 1 drop into the right eye 4 (four) times daily. 08/21/17   Cuthriell, Dorn BIRCH, PA-C  meloxicam  (MOBIC ) 7.5 MG tablet Take 1 tablet (7.5 mg total) by mouth in the morning  and at bedtime for 10 days. 02/16/24 02/26/24  Kamil Hanigan, DO  nystatin  cream (MYCOSTATIN ) Apply to affected area 2 times daily Patient not taking: Reported on 01/28/2024 01/22/24   Arvis Huxley NOVAK, PA-C  pantoprazole  (PROTONIX ) 40 MG tablet Take 1 tablet (40 mg total) by mouth 2 (two) times daily. 10/17/23   Bernardino Ditch, NP    Family History History reviewed. No pertinent family history.  Social History Social History   Tobacco Use   Smoking status: Former   Smokeless tobacco: Never  Vaping Use   Vaping status: Never Used  Substance Use Topics   Alcohol use: Yes    Alcohol/week: 1.0 standard drink of alcohol    Types: 1 Cans of beer per week   Drug use: Yes    Types: Marijuana     Allergies   Patient has no known allergies.   Review of Systems Review of Systems: :negative unless otherwise stated in HPI.      Physical Exam Triage Vital Signs ED Triage Vitals  Encounter Vitals Group     BP 02/16/24 0836 (!) 133/90     Girls Systolic BP Percentile --      Girls Diastolic BP Percentile --      Boys Systolic BP Percentile --      Boys Diastolic BP Percentile --  Pulse Rate 02/16/24 0836 90     Resp 02/16/24 0836 16     Temp 02/16/24 0836 98.7 F (37.1 C)     Temp Source 02/16/24 0836 Oral     SpO2 02/16/24 0836 98 %     Weight 02/16/24 0834 273 lb 9.6 oz (124.1 kg)     Height --      Head Circumference --      Peak Flow --      Pain Score 02/16/24 0834 8     Pain Loc --      Pain Education --      Exclude from Growth Chart --    No data found.  Updated Vital Signs BP (!) 133/90 (BP Location: Left Arm)   Pulse 90   Temp 98.7 F (37.1 C) (Oral)   Resp 16   Wt 124.1 kg   SpO2 98%   BMI 39.26 kg/m   Visual Acuity Right Eye Distance:   Left Eye Distance:   Bilateral Distance:    Right Eye Near:   Left Eye Near:    Bilateral Near:     Physical Exam GEN: well appearing male in no acute distress  CVS: well perfused  RESP: speaking in full  sentences without pause, no respiratory distress  MSK:   Ankle/Foot, right: TTP noted at the base of the fifth metatarsal, bilateral malleoli, lateral calcaneus with ecchymosis and edema. No visible  bony deformity.  No evidence of tibiotalar deviation; Range of motion is limited by pain and swelling in all directions. Strength is 5/5 in all directions. No tenderness at the insertion/body/myotendinous junction of the Achilles tendon; Talar dome non-tender; Unremarkable calcaneal squeeze; No plantar calcaneal tenderness; No tenderness over the navicular prominence or  over cuboid; No tenderness at the distal metatarsals; Able to walk 4 steps.     UC Treatments / Results  Labs (all labs ordered are listed, but only abnormal results are displayed) Labs Reviewed - No data to display  EKG   Radiology DG Foot Complete Right Result Date: 02/16/2024 EXAM: 3 OR MORE VIEW(S) XRAY OF THE RIGHT FOOT 02/16/2024 09:28:41 AM COMPARISON: Right ankle series today, and previous right foot x-rays 06/09/2008. CLINICAL HISTORY: 33 year old male  injury, swelling, pain for 2 days. FINDINGS: BONES AND JOINTS: No acute fracture or dislocation identified about the right foot. No focal osseous lesion. SOFT TISSUES: Soft tissue swelling in the ankle. IMPRESSION: 1. No acute fracture or dislocation identified about the right foot. Electronically signed by: Helayne Hurst MD 02/16/2024 09:46 AM EST RP Workstation: [id number]   DG Ankle Complete Right Result Date: 02/16/2024 EXAM: 3 OR MORE VIEW(S) XRAY OF THE RIGHT ANKLE 02/16/2024 09:28:41 AM CLINICAL HISTORY: 33 year old male with injury, swelling, and pain for 2 days. COMPARISON: Right foot series 06/09/2008. FINDINGS: BONES AND JOINTS: No acute fracture. No focal osseous lesion. No joint dislocation. Small joint effusion. SOFT TISSUES: Diffuse soft tissue edema, greatest laterally. IMPRESSION: 1. Small ankle joint effusion. Soft tissue swelling. 2. No acute fracture or  dislocation identified about the right ankle. Electronically signed by: Helayne Hurst MD 02/16/2024 09:45 AM EST RP Workstation: [id number]     Procedures Procedures (including critical care time)  Medications Ordered in UC Medications  ibuprofen  (ADVIL ) tablet 800 mg (800 mg Oral Given 02/16/24 0939)    Initial Impression / Assessment and Plan / UC Course  I have reviewed the triage vital signs and the nursing notes.  Pertinent labs & imaging results that were  available during my care of the patient were reviewed by me and considered in my medical decision making (see chart for details).      Pt is a 33 y.o.  male with 1 day of right foot and ankle pain after attempting to jump over a fire.  Vital signs stable.  Given ibuprofen  for pain.  On exam, pt has tenderness at bilateral malleoli and base of the fifth metatarsal and lateral calcaneus concerning for fracture.   Obtained right foot and ankle  plain films.  Personally interpreted by me were unremarkable for fracture or dislocation. Radiologist report reviewed and additionally notes small ankle effusion.  He declined crutches.  Placed in a right ankle Aircast prior to discharge.  Patient to gradually return to normal activities, as tolerated and continue ordinary activities within the limits permitted by pain. Prescribed Mobic  twice daily for pain relief.  Tylenol  PRN. Advised patient to avoid OTC NSAIDs while taking prescription NSAID.   Patient to follow up with orthopedic provider, if symptoms do not improve with conservative treatment.  Return and ED precautions given. Understanding voiced. Discussed MDM, treatment plan and plan for follow-up with patient  who agrees with plan.   Final Clinical Impressions(s) / UC Diagnoses   Final diagnoses:  Injury of ankle and foot, right, initial encounter     Discharge Instructions      If medication was prescribed, stop by the pharmacy to pick up your prescriptions.  For your  pain,  Take 1500 mg Tylenol  twice a day, take Meloxicam /Mobic  twice a day,  as needed for pain. Wear your air cast/ankle brace with activity.  Rest and elevate the affected painful area.  Apply cold compresses intermittently, as needed.  As pain recedes, begin normal activities slowly as tolerated.  Follow up with primary care provider or an orthopedic provider, if symptoms persist.  Watch for worsening symptoms such as an increasing weakness or loss of sensation, increasing pain and/or the loss of bladder or bowel function. Should any of these occur, go to the emergency department immediately.        ED Prescriptions     Medication Sig Dispense Auth. Provider   meloxicam  (MOBIC ) 7.5 MG tablet Take 1 tablet (7.5 mg total) by mouth in the morning and at bedtime for 10 days. 20 tablet Shuntia Exton, DO      PDMP not reviewed this encounter.   Kriste Berth, DO 02/16/24 1306

## 2024-02-16 NOTE — Discharge Instructions (Addendum)
 If medication was prescribed, stop by the pharmacy to pick up your prescriptions.  For your  pain, Take 1500 mg Tylenol  twice a day, take Meloxicam /Mobic  twice a day,  as needed for pain. Wear your air cast/ankle brace with activity.  Rest and elevate the affected painful area.  Apply cold compresses intermittently, as needed.  As pain recedes, begin normal activities slowly as tolerated.  Follow up with primary care provider or an orthopedic provider, if symptoms persist.  Watch for worsening symptoms such as an increasing weakness or loss of sensation, increasing pain and/or the loss of bladder or bowel function. Should any of these occur, go to the emergency department immediately.
